# Patient Record
Sex: Male | Born: 1945
Health system: Southern US, Community
[De-identification: ages and names within clinical notes are randomized; demographics above are authoritative.]

## PROBLEM LIST (undated history)

## (undated) DIAGNOSIS — E785 Hyperlipidemia, unspecified: Secondary | ICD-10-CM

## (undated) DIAGNOSIS — R5383 Other fatigue: Secondary | ICD-10-CM

## (undated) DIAGNOSIS — I10 Essential (primary) hypertension: Secondary | ICD-10-CM

## (undated) HISTORY — DX: Hyperlipidemia, unspecified: E78.5

## (undated) HISTORY — PX: TONSILLECTOMY: SUR1361

## (undated) HISTORY — PX: APPENDECTOMY: SHX54

## (undated) HISTORY — DX: Essential (primary) hypertension: I10

## (undated) HISTORY — DX: Other fatigue: R53.83

---

## 2004-01-21 ENCOUNTER — Ambulatory Visit (HOSPITAL_COMMUNITY): Admission: RE | Admit: 2004-01-21 | Discharge: 2004-01-21 | Payer: Self-pay | Admitting: *Deleted

## 2010-06-16 ENCOUNTER — Encounter: Payer: Self-pay | Admitting: Cardiovascular Disease

## 2010-06-16 ENCOUNTER — Ambulatory Visit (INDEPENDENT_AMBULATORY_CARE_PROVIDER_SITE_OTHER): Payer: BC Managed Care – PPO | Admitting: Cardiovascular Disease

## 2010-06-16 DIAGNOSIS — R5381 Other malaise: Secondary | ICD-10-CM

## 2010-06-16 DIAGNOSIS — R5383 Other fatigue: Secondary | ICD-10-CM | POA: Insufficient documentation

## 2010-06-16 DIAGNOSIS — R0789 Other chest pain: Secondary | ICD-10-CM

## 2010-06-16 DIAGNOSIS — E785 Hyperlipidemia, unspecified: Secondary | ICD-10-CM

## 2010-06-16 DIAGNOSIS — I10 Essential (primary) hypertension: Secondary | ICD-10-CM

## 2010-06-16 NOTE — Assessment & Plan Note (Signed)
His blood pressure is fairly well-controlled. He is tolerating the Bystolic very well. We'll continue with the same Medications. I'll see him in 6 months

## 2010-06-16 NOTE — Assessment & Plan Note (Signed)
He has not had any further episodes of chest pain. We'll continue to watch him closely.

## 2010-06-16 NOTE — Progress Notes (Signed)
Problem list: Hypertension Chest pain Fatigue  Medications:  Vitamin E Aspirin 81 mg a day Bystolic 10 mg a day Simvastatin 40 mg a day  Subjective:  Tim James is a middle-aged gentleman with the above-noted medical history. He presents today for followup visit. He has not had any problems. He is tolerating his medications quite well.  The patient is alert and oriented x 3.  The mood and affect are normal.  Wt. Is 221.   BP is 130/80.  HR is 80.  The HEENT exam reveals that the sclera are nonicteric.  The mucous membranes are moist.  The carotids are 2+ without bruits.  There is no thyromegaly.  There is no JVD.  The lungs are clear.  The chest wall is non tender.  The heart exam reveals a regular rate with a normal S1 and S2.  There are no murmurs, gallops, or rubs.  The PMI is not displaced.   Abdominal exam reveals good bowel sounds.  There is no guarding or rebound.  There is no hepatosplenomegaly or tenderness.  There are no masses.  Exam of the legs reveal no clubbing, cyanosis, or edema.  The legs are without rashes.  The distal pulses are intact.  Cranial nerves II - XII are intact.  Motor and sensory functions are intact.  The gait is normal.

## 2010-06-16 NOTE — Assessment & Plan Note (Signed)
He was then simvastatin for now. We will check a lipid profile and CMET at his medical doctor in the next week or so.

## 2010-06-16 NOTE — Assessment & Plan Note (Signed)
His fatigue seems to be stable.

## 2010-10-23 ENCOUNTER — Other Ambulatory Visit: Payer: Self-pay | Admitting: Cardiology

## 2010-10-24 ENCOUNTER — Other Ambulatory Visit: Payer: Self-pay | Admitting: *Deleted

## 2010-10-24 MED ORDER — NEBIVOLOL HCL 10 MG PO TABS: 10.0000 mg | ORAL_TABLET | Freq: Every day | ORAL | Status: DC

## 2010-10-24 MED ORDER — SIMVASTATIN 40 MG PO TABS: 40.0000 mg | ORAL_TABLET | Freq: Every day | ORAL | Status: DC

## 2010-10-24 NOTE — Telephone Encounter (Signed)
Fax received from pharmacy. Refill completed. Spoke with pt had labs drawn yesterday at [facility] and is having them faxed here Alfonso Ramus RN

## 2011-04-18 ENCOUNTER — Other Ambulatory Visit: Payer: Self-pay | Admitting: Cardiovascular Disease

## 2011-08-06 ENCOUNTER — Other Ambulatory Visit: Payer: Self-pay | Admitting: Cardiovascular Disease

## 2011-08-10 NOTE — Telephone Encounter (Signed)
Pt needs appointment then refill can be made Fax Received. Refill Completed. Tim James (R.M.A)   

## 2011-09-09 ENCOUNTER — Encounter: Payer: Self-pay | Admitting: *Deleted

## 2011-10-20 ENCOUNTER — Other Ambulatory Visit: Payer: Self-pay | Admitting: *Deleted

## 2011-10-20 MED ORDER — NEBIVOLOL HCL 10 MG PO TABS: 10.0000 mg | ORAL_TABLET | Freq: Every day | ORAL | Status: DC

## 2011-10-20 NOTE — Telephone Encounter (Signed)
Pt needs appointment then refill can be made Fax Received. Refill Completed. Amad Mau Chowoe (R.M.A)   

## 2011-10-22 ENCOUNTER — Other Ambulatory Visit: Payer: Self-pay | Admitting: *Deleted

## 2011-10-22 MED ORDER — SIMVASTATIN 40 MG PO TABS: 40.0000 mg | ORAL_TABLET | Freq: Every day | ORAL | Status: DC

## 2011-10-22 NOTE — Telephone Encounter (Signed)
Opened in Error Pt needs appointment then refill can be made

## 2011-10-22 NOTE — Telephone Encounter (Signed)
Pt needs appointment then more refill can be made Fax Received. Refill Completed. Tim James (R.M.A)

## 2011-10-26 ENCOUNTER — Other Ambulatory Visit: Payer: Self-pay | Admitting: *Deleted

## 2011-10-26 NOTE — Telephone Encounter (Signed)
Opened in Error.

## 2011-11-27 ENCOUNTER — Ambulatory Visit (INDEPENDENT_AMBULATORY_CARE_PROVIDER_SITE_OTHER): Payer: BC Managed Care – PPO | Admitting: Cardiovascular Disease

## 2011-11-27 ENCOUNTER — Encounter: Payer: Self-pay | Admitting: Cardiovascular Disease

## 2011-11-27 VITALS — BP 112/64 | HR 64 | Ht 71.75 in | Wt 226.0 lb

## 2011-11-27 DIAGNOSIS — E785 Hyperlipidemia, unspecified: Secondary | ICD-10-CM

## 2011-11-27 DIAGNOSIS — I10 Essential (primary) hypertension: Secondary | ICD-10-CM

## 2011-11-27 LAB — HEPATIC FUNCTION PANEL
Alkaline Phosphatase: 61 U/L (ref 39–117)
Bilirubin, Direct: 0.1 mg/dL (ref 0.0–0.3)
Total Bilirubin: 0.8 mg/dL (ref 0.3–1.2)

## 2011-11-27 LAB — LIPID PANEL
Cholesterol: 174 mg/dL (ref 0–200)
LDL Cholesterol: 106 mg/dL — ABNORMAL HIGH (ref 0–99)
VLDL: 17.6 mg/dL (ref 0.0–40.0)

## 2011-11-27 LAB — BASIC METABOLIC PANEL
CO2: 29 mEq/L (ref 19–32)
Calcium: 9.3 mg/dL (ref 8.4–10.5)
GFR: 73.97 mL/min (ref >60.00)
Potassium: 3.9 mEq/L (ref 3.5–5.1)
Sodium: 138 mEq/L (ref 135–145)

## 2011-11-27 NOTE — Patient Instructions (Addendum)
Your physician recommends that you return for a FASTING lipid profile: today and in 1 year   Your physician wants you to follow-up in: 1 year  You will receive a reminder letter in the mail two months in advance. If you don't receive a letter, please call our office to schedule the follow-up appointment.  

## 2011-11-27 NOTE — Assessment & Plan Note (Signed)
Tim James is doing well. He's tolerating the simvastatin. We'll check fasting labs today. I'll see him again in one year for repeat office visit and fasting labs.

## 2011-11-27 NOTE — Assessment & Plan Note (Signed)
Vitals blood pressure is normal. We'll continue with the current dose of Bystolic.

## 2011-11-27 NOTE — Progress Notes (Signed)
    Tim James Date of Birth  [birth year]       [facility] Office [address]  [address] [place], [place]  [address]   [place], [place]  [address] [phone]     [phone]   Fax  [phone]    Fax [phone]  Problem List: 1.  Hypertension  2.  Chest pain  3.  Fatigue 4. Hyperlipidemia  History of Present Illness:  Tim James is a 66 y.o. gentleman with hx of HTN and hyperlipidemia.  He has done well.  He does not exercise as much as he would like.  He has not had any chest pain or dyspnea.   Current Outpatient Prescriptions on File Prior to Visit  Medication Sig Dispense Refill  . Ascorbic Acid (VITAMIN C) 100 MG tablet Take 100 mg by mouth daily.        [name] aspirin 81 MG tablet Take 81 mg by mouth daily.        . fish oil-omega-3 fatty acids 1000 MG capsule Take 2 g by mouth daily.        . nebivolol (BYSTOLIC) 10 MG tablet Take 1 tablet (10 mg total) by mouth daily.  30 tablet  0  . saw palmetto 160 MG capsule Take 160 mg by mouth 2 (two) times daily.        . simvastatin (ZOCOR) 40 MG tablet Take 1 tablet (40 mg total) by mouth at bedtime.  30 tablet  1  . vitamin E 400 UNIT capsule Take 400 Units by mouth daily.          Not on File  Past Medical History  Diagnosis Date  . Hypertension   . Hyperlipidemia   . Fatigue     Past Surgical History  Procedure Date  . Appendectomy   . Tonsillectomy     History  Smoking status  . Never Smoker   Smokeless tobacco  . Never Used    History  Alcohol Use No    Family History  Problem Relation Age of Onset  . Hypertension Mother   . Angina Mother   . Hypertension Father   . Hypertension Sister   . Hypertension Brother   . Hypertension Sister     Reviw of Systems:  Reviewed in the HPI.  All other systems are negative.  Physical Exam: Blood pressure 112/64, pulse 64, height 5' 11.75" (1.822 m), weight 226 lb (102.513 kg). General: Well  developed, well nourished, in no acute distress.  Head: Normocephalic, atraumatic, sclera non-icteric, mucus membranes are moist,   Neck: Supple. Carotids are 2 + without bruits. No JVD  Lungs: Clear bilaterally to auscultation.  Heart: regular rate.  normal  S1 S2. No murmurs, gallops or rubs.  Abdomen: Soft, non-tender, non-distended with normal bowel sounds. No hepatomegaly. No rebound/guarding. No masses.  Msk:  Strength and tone are normal  Extremities: No clubbing or cyanosis. No edema.  Distal pedal pulses are 2+ and equal bilaterally.  Neuro: Alert and oriented X 3. Moves all extremities spontaneously.  Psych:  Responds to questions appropriately with a normal affect.  ECG: 11/27/2011 normal sinus rhythm at 65 beats a minute. He has no ST or T wave changes.  Assessment / Plan:

## 2011-12-09 ENCOUNTER — Other Ambulatory Visit: Payer: Self-pay | Admitting: *Deleted

## 2011-12-09 MED ORDER — NEBIVOLOL HCL 10 MG PO TABS: 10.0000 mg | ORAL_TABLET | Freq: Every day | ORAL | Status: DC

## 2011-12-09 NOTE — Telephone Encounter (Signed)
Fax Received. Refill Completed. Tim James (R.M.A)   

## 2012-01-05 ENCOUNTER — Telehealth: Payer: Self-pay | Admitting: Cardiovascular Disease

## 2012-01-05 MED ORDER — SIMVASTATIN 40 MG PO TABS: 40.0000 mg | ORAL_TABLET | Freq: Every day | ORAL | Status: DC

## 2012-01-05 NOTE — Telephone Encounter (Signed)
Fax Received. Refill Completed. Stefana Lodico Chowoe (R.M.A)   

## 2012-01-05 NOTE — Telephone Encounter (Signed)
New Problem:    Called in needing a 30 day refill of the patient's simvastatin (ZOCOR) 40 MG tablet.  Please call back.

## 2012-02-19 ENCOUNTER — Encounter: Payer: Self-pay | Admitting: *Deleted

## 2012-06-22 ENCOUNTER — Other Ambulatory Visit: Payer: Self-pay | Admitting: *Deleted

## 2012-06-22 MED ORDER — NEBIVOLOL HCL 10 MG PO TABS: 10.0000 mg | ORAL_TABLET | Freq: Every day | ORAL | Status: DC

## 2012-06-22 NOTE — Telephone Encounter (Signed)
Fax Received. Refill Completed. Josiah Wojtaszek Chowoe (R.M.A)   

## 2012-06-30 ENCOUNTER — Other Ambulatory Visit: Payer: Self-pay | Admitting: *Deleted

## 2012-06-30 NOTE — Telephone Encounter (Signed)
Opened in Error.

## 2012-07-28 ENCOUNTER — Other Ambulatory Visit: Payer: Self-pay | Admitting: *Deleted

## 2012-07-28 MED ORDER — SIMVASTATIN 40 MG PO TABS: 40.0000 mg | ORAL_TABLET | Freq: Every day | ORAL | Status: DC

## 2012-07-28 NOTE — Telephone Encounter (Signed)
Fax Received. Refill Completed. Teigen Parslow Chowoe (R.M.A)   

## 2012-12-21 ENCOUNTER — Other Ambulatory Visit: Payer: Self-pay | Admitting: *Deleted

## 2012-12-21 MED ORDER — NEBIVOLOL HCL 10 MG PO TABS: 10.0000 mg | ORAL_TABLET | Freq: Every day | ORAL | Status: DC

## 2012-12-21 NOTE — Telephone Encounter (Signed)
Fax Received. Refill Completed. Tim James (R.M.A)   

## 2013-03-20 ENCOUNTER — Ambulatory Visit (INDEPENDENT_AMBULATORY_CARE_PROVIDER_SITE_OTHER): Payer: BC Managed Care – PPO | Admitting: Cardiovascular Disease

## 2013-03-20 ENCOUNTER — Encounter: Payer: Self-pay | Admitting: Cardiovascular Disease

## 2013-03-20 VITALS — BP 129/76 | HR 74 | Ht 71.0 in | Wt 230.0 lb

## 2013-03-20 DIAGNOSIS — I10 Essential (primary) hypertension: Secondary | ICD-10-CM

## 2013-03-20 DIAGNOSIS — E785 Hyperlipidemia, unspecified: Secondary | ICD-10-CM

## 2013-03-20 NOTE — Progress Notes (Signed)
[name] Date of Birth  1946-03-27       [facility] Office [address]  [address] [place], [place]  [address]   [place], [place]  [address] [phone]     [phone]   Fax  [phone]    Fax [phone]  Problem List: 1.  Hypertension  2.  Chest pain  3.  Fatigue 4. Hyperlipidemia  History of Present Illness:  Tim James is a 67 y.o. gentleman with hx of HTN and hyperlipidemia.  He has done well.  He does not exercise as much as he would like.  He has not had any chest pain or dyspnea.  Nov.  10, 2014:  Tim James is doing well.   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    He needs to walk more.      Current Outpatient Prescriptions on File Prior to Visit  Medication Sig Dispense Refill  . Ascorbic Acid (VITAMIN C) 100 MG tablet Take 100 mg by mouth daily.        [name] aspirin 81 MG tablet Take 81 mg by mouth daily.        . B Complex-C (B-COMPLEX WITH VITAMIN C) tablet Take 1 tablet by mouth daily.      . Cholecalciferol (VITAMIN D-3 PO) Take 1,000 Units by mouth daily.      . fish oil-omega-3 fatty acids 1000 MG capsule Take 2 g by mouth daily.        . Flaxseed, Linseed, 1000 MG CAPS Take 1,000 mg by mouth daily.      [name] glucosamine-chondroitin 500-400 MG tablet Take 1 tablet by mouth daily.      . nebivolol (BYSTOLIC) 10 MG tablet Take 1 tablet (10 mg total) by mouth daily.  30 tablet  3  . saw palmetto 160 MG capsule Take 160 mg by mouth 2 (two) times daily.        . simvastatin (ZOCOR) 40 MG tablet Take 1 tablet (40 mg total) by mouth at bedtime.  30 tablet  4  . vitamin E 400 UNIT capsule Take 400 Units by mouth daily.         No current facility-administered medications on file prior to visit.    No Known Allergies  Past Medical History  Diagnosis Date  . Hypertension   . Hyperlipidemia   . Fatigue     Past Surgical History  Procedure Laterality Date  . Appendectomy      . Tonsillectomy      History  Smoking status  . Never Smoker   Smokeless tobacco  . Never Used    History  Alcohol Use No    Family History  Problem Relation Age of Onset  . Hypertension Mother   . Angina Mother   . Hypertension Father   . Hypertension Sister   . Hypertension Brother   . Hypertension Sister     Reviw of Systems:  Reviewed in the HPI.  All other systems are negative.  Physical Exam: Blood pressure 129/76, pulse 74, height 5\' 11"  (1.803 m), weight 230 lb (104.327 kg). General: Well developed, well nourished, in no acute distress.  Head: Normocephalic, atraumatic, sclera non-icteric, mucus membranes are moist,   Neck: Supple. Carotids are 2 + without bruits. No JVD  Lungs: Clear bilaterally to auscultation.  Heart: regular rate.  normal  S1 S2. No murmurs, gallops or rubs.  Abdomen:  Soft, non-tender, non-distended with normal bowel sounds. No hepatomegaly. No rebound/guarding. No masses.  Msk:  Strength and tone are normal  Extremities: No clubbing or cyanosis. No edema.  Distal pedal pulses are 2+ and equal bilaterally.  Neuro: Alert and oriented X 3. Moves all extremities spontaneously.  Psych:  Responds to questions appropriately with a normal affect.  ECG: 03/20/2013 normal sinus rhythm at 65 beats a minute. He has no ST or T wave changes.  Assessment / Plan:

## 2013-03-20 NOTE — Assessment & Plan Note (Signed)
He remains on simvastatin. We'll check his fasting labs today. I seen again in one year. We'll check fasting labs in one year.

## 2013-03-20 NOTE — Patient Instructions (Signed)
Your physician recommends that you return for a FASTING lipid profile: TODAY  Your physician wants you to follow-up in: 1 YEAR  You will receive a reminder letter in the mail two months in advance. If you don't receive a letter, please call our office to schedule the follow-up appointment.  Your physician recommends that you continue on your current medications as directed. Please refer to the Current Medication list given to you today.   

## 2013-03-20 NOTE — Assessment & Plan Note (Signed)
Tim James is doing well. He's not having episodes of chest pain. We'll continue the same medications.

## 2013-03-21 LAB — LIPID PANEL
LDL Cholesterol: 88 mg/dL (ref 0–99)
Total CHOL/HDL Ratio: 3
VLDL: 25.2 mg/dL (ref 0.0–40.0)

## 2013-03-21 LAB — BASIC METABOLIC PANEL
BUN: 14 mg/dL (ref 6–23)
Chloride: 101 mEq/L (ref 96–112)
Creatinine, Ser: 1 mg/dL (ref 0.4–1.5)
Glucose, Bld: 93 mg/dL (ref 70–99)
Potassium: 4.1 mEq/L (ref 3.5–5.1)

## 2013-03-21 LAB — HEPATIC FUNCTION PANEL
Albumin: 4.1 g/dL (ref 3.5–5.2)
Alkaline Phosphatase: 62 U/L (ref 39–117)
Bilirubin, Direct: 0.1 mg/dL (ref 0.0–0.3)
Total Bilirubin: 0.7 mg/dL (ref 0.3–1.2)

## 2013-05-09 ENCOUNTER — Other Ambulatory Visit: Payer: Self-pay

## 2013-05-09 MED ORDER — SIMVASTATIN 40 MG PO TABS: 40.0000 mg | ORAL_TABLET | Freq: Every day | ORAL | Status: DC

## 2013-05-24 ENCOUNTER — Other Ambulatory Visit: Payer: Self-pay

## 2013-05-24 MED ORDER — NEBIVOLOL HCL 10 MG PO TABS: 10.0000 mg | ORAL_TABLET | Freq: Every day | ORAL | Status: DC

## 2013-12-03 ENCOUNTER — Other Ambulatory Visit: Payer: Self-pay | Admitting: Cardiovascular Disease

## 2014-01-03 ENCOUNTER — Other Ambulatory Visit: Payer: Self-pay | Admitting: Cardiovascular Disease

## 2014-02-10 ENCOUNTER — Other Ambulatory Visit: Payer: Self-pay | Admitting: Cardiovascular Disease

## 2014-03-19 ENCOUNTER — Other Ambulatory Visit: Payer: Self-pay | Admitting: Cardiovascular Disease

## 2014-03-21 ENCOUNTER — Other Ambulatory Visit (INDEPENDENT_AMBULATORY_CARE_PROVIDER_SITE_OTHER): Payer: BC Managed Care – PPO | Admitting: *Deleted

## 2014-03-21 ENCOUNTER — Ambulatory Visit (INDEPENDENT_AMBULATORY_CARE_PROVIDER_SITE_OTHER): Payer: BC Managed Care – PPO | Admitting: Cardiovascular Disease

## 2014-03-21 ENCOUNTER — Encounter: Payer: Self-pay | Admitting: Cardiovascular Disease

## 2014-03-21 VITALS — BP 120/78 | HR 60 | Ht 71.0 in | Wt 208.8 lb

## 2014-03-21 DIAGNOSIS — R0789 Other chest pain: Secondary | ICD-10-CM

## 2014-03-21 DIAGNOSIS — E785 Hyperlipidemia, unspecified: Secondary | ICD-10-CM

## 2014-03-21 DIAGNOSIS — I10 Essential (primary) hypertension: Secondary | ICD-10-CM

## 2014-03-21 LAB — BASIC METABOLIC PANEL
BUN: 16 mg/dL (ref 6–23)
CHLORIDE: 102 meq/L (ref 96–112)
CO2: 25 mEq/L (ref 19–32)
Calcium: 9.4 mg/dL (ref 8.4–10.5)
Creatinine, Ser: 1 mg/dL (ref 0.4–1.5)
GFR: 80.33 mL/min (ref >60.00)
Glucose, Bld: 112 mg/dL — ABNORMAL HIGH (ref 70–99)
Potassium: 4.1 mEq/L (ref 3.5–5.1)
SODIUM: 140 meq/L (ref 135–145)

## 2014-03-21 LAB — HEPATIC FUNCTION PANEL
ALK PHOS: 65 U/L (ref 39–117)
ALT: 25 U/L (ref 0–53)
AST: 23 U/L (ref 0–37)
Albumin: 3.6 g/dL (ref 3.5–5.2)
BILIRUBIN DIRECT: 0.1 mg/dL (ref 0.0–0.3)
Total Bilirubin: 1 mg/dL (ref 0.2–1.2)
Total Protein: 7.3 g/dL (ref 6.0–8.3)

## 2014-03-21 LAB — LIPID PANEL
Cholesterol: 156 mg/dL (ref 0–200)
HDL: 40.7 mg/dL (ref >39.00)
LDL Cholesterol: 104 mg/dL — ABNORMAL HIGH (ref 0–99)
NONHDL: 115.3
Total CHOL/HDL Ratio: 4
Triglycerides: 56 mg/dL (ref 0.0–149.0)
VLDL: 11.2 mg/dL (ref 0.0–40.0)

## 2014-03-21 NOTE — Assessment & Plan Note (Signed)
Blood pressure is well-controlled. Continue current medications. He's lost 20 pounds since this past September. Continue with the diet and exercise program.

## 2014-03-21 NOTE — Assessment & Plan Note (Signed)
He continues on the current dose of simvastatin. His lipids looked great last year. We'll check fasting labs today. We'll send a copy of these to his primary medical doctor.

## 2014-03-21 NOTE — Assessment & Plan Note (Signed)
He has  not had any episodes of CP in years. Continue to follow

## 2014-03-21 NOTE — Progress Notes (Signed)
Tim James Date of Birth  1945/09/09       [facility] Office [address], [address], [place], [place]  [address]   [place], [place]  [address] [phone]     [phone]   Fax  [phone]    Fax [phone]  Problem List: 1.  Hypertension  2.  Chest pain  3.  Fatigue 4. Hyperlipidemia  History of Present Illness:  Tim James is a 68 y.o. gentleman with hx of HTN and hyperlipidemia.  He has done well.  He does not exercise as much as he would like.  He has not had any chest pain or dyspnea.  Nov.  10, 2014:  Tim James is doing well.   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    He needs to walk more.     Nov. 11, 2015:  Tim James is seen back today for follow up of his HTN and hyperlipidemia. He still teaches middle school math.  Wants to work until 39. No Cp or dyspnea.   Walks around at school.  Walks the dogs at night. Has has lost about 20 lbs since Sept.  Feels better.   Current Outpatient Prescriptions on File Prior to Visit  Medication Sig Dispense Refill  . Ascorbic Acid (VITAMIN C) 100 MG tablet Take 100 mg by mouth daily.      [name] aspirin 81 MG tablet Take 81 mg by mouth daily.      . B Complex-C (B-COMPLEX WITH VITAMIN C) tablet Take 1 tablet by mouth daily.    . Cholecalciferol (VITAMIN D-3 PO) Take 1,000 Units by mouth daily.    . fish oil-omega-3 fatty acids 1000 MG capsule Take 2 g by mouth daily.      . Flaxseed, Linseed, 1000 MG CAPS Take 1,000 mg by mouth daily.    [name] glucosamine-chondroitin 500-400 MG tablet Take 1 tablet by mouth daily.    . nebivolol (BYSTOLIC) 10 MG tablet Take 1 tablet (10 mg total) by mouth daily. 90 tablet 3  . saw palmetto 160 MG capsule Take 160 mg by mouth 2 (two) times daily.      . simvastatin (ZOCOR) 40 MG tablet TAKE 1 TABLET BY MOUTH AT BEDTIME. 30 tablet 3  . vitamin E 400 UNIT capsule Take 400 Units by mouth daily.       No current  facility-administered medications on file prior to visit.    No Known Allergies  Past Medical History  Diagnosis Date  . Hypertension   . Hyperlipidemia   . Fatigue     Past Surgical History  Procedure Laterality Date  . Appendectomy    . Tonsillectomy      History  Smoking status  . Never Smoker   Smokeless tobacco  . Never Used    History  Alcohol Use No    Family History  Problem Relation Age of Onset  . Hypertension Mother   . Angina Mother   . Hypertension Father   . Hypertension Sister   . Hypertension Brother   . Hypertension Sister     Reviw of Systems:  Reviewed in the HPI.  All other systems are negative.  Physical Exam: Blood pressure 120/78, pulse 60, height 5\' 11"  (1.803 m), weight 208 lb 12.8 oz (94.711 kg). General: Well developed, well nourished, in no acute distress.  Head: Normocephalic, atraumatic, sclera non-icteric, mucus membranes are moist,  Neck: Supple. Carotids are 2 + without bruits. No JVD  Lungs: Clear bilaterally to auscultation.  Heart: regular rate.  normal  S1 S2. No murmurs, gallops or rubs.  Abdomen: Soft, non-tender, non-distended with normal bowel sounds. No hepatomegaly. No rebound/guarding. No masses.  Msk:  Strength and tone are normal  Extremities: No clubbing or cyanosis. No edema.  Distal pedal pulses are 2+ and equal bilaterally.  Neuro: Alert and oriented X 3. Moves all extremities spontaneously.  Psych:  Responds to questions appropriately with a normal affect.  ECG: 03/21/2014 NSR at 60, normal ECG   Assessment / Plan:

## 2014-03-21 NOTE — Patient Instructions (Signed)
Your physician recommends that you have lab work:  TODAY - cholesterol, liver, basic metabolic panel  Your physician recommends that you continue on your current medications as directed. Please refer to the Current Medication list given to you today.  Your physician wants you to follow-up in: 1 year with Dr. Nahser. You will receive a reminder letter in the mail two months in advance. If you don't receive a letter, please call our office to schedule the follow-up appointment.  

## 2014-06-16 ENCOUNTER — Other Ambulatory Visit: Payer: Self-pay | Admitting: Cardiovascular Disease

## 2014-08-20 ENCOUNTER — Other Ambulatory Visit: Payer: Self-pay | Admitting: Cardiovascular Disease

## 2014-11-05 ENCOUNTER — Other Ambulatory Visit: Payer: Self-pay

## 2015-04-02 ENCOUNTER — Other Ambulatory Visit: Payer: Self-pay | Admitting: Cardiovascular Disease

## 2015-05-31 ENCOUNTER — Ambulatory Visit: Payer: BC Managed Care – PPO | Admitting: Cardiovascular Disease

## 2015-06-12 ENCOUNTER — Ambulatory Visit (INDEPENDENT_AMBULATORY_CARE_PROVIDER_SITE_OTHER): Payer: BC Managed Care – PPO | Admitting: Cardiovascular Disease

## 2015-06-12 ENCOUNTER — Encounter: Payer: Self-pay | Admitting: Cardiovascular Disease

## 2015-06-12 VITALS — BP 152/102 | HR 59 | Ht 71.0 in | Wt 207.4 lb

## 2015-06-12 DIAGNOSIS — E785 Hyperlipidemia, unspecified: Secondary | ICD-10-CM | POA: Diagnosis not present

## 2015-06-12 DIAGNOSIS — I10 Essential (primary) hypertension: Secondary | ICD-10-CM | POA: Diagnosis not present

## 2015-06-12 LAB — COMPREHENSIVE METABOLIC PANEL
ALBUMIN: 4.3 g/dL (ref 3.6–5.1)
ALK PHOS: 53 U/L (ref 40–115)
ALT: 25 U/L (ref 9–46)
AST: 23 U/L (ref 10–35)
BILIRUBIN TOTAL: 1.1 mg/dL (ref 0.2–1.2)
BUN: 19 mg/dL (ref 7–25)
CALCIUM: 9.6 mg/dL (ref 8.6–10.3)
CO2: 31 mmol/L (ref 20–31)
Chloride: 100 mmol/L (ref 98–110)
Creat: 0.92 mg/dL (ref 0.70–1.25)
GLUCOSE: 103 mg/dL — AB (ref 65–99)
Potassium: 4.4 mmol/L (ref 3.5–5.3)
Sodium: 140 mmol/L (ref 135–146)
Total Protein: 7.2 g/dL (ref 6.1–8.1)

## 2015-06-12 LAB — LIPID PANEL
CHOLESTEROL: 153 mg/dL (ref 125–200)
HDL: 53 mg/dL (ref >=40)
LDL Cholesterol: 87 mg/dL (ref <130)
Total CHOL/HDL Ratio: 2.9 Ratio (ref <=5.0)
Triglycerides: 64 mg/dL (ref <150)
VLDL: 13 mg/dL (ref <30)

## 2015-06-12 NOTE — Progress Notes (Signed)
Tim James Date of Birth  02/23/1946       [facility] Office [address], [place], [place]  [address]   [place], [place]  [address] [phone]     [phone]   Fax  [phone]    Fax [phone]  Problem List: 1.  Hypertension  2.  Chest pain  3.  Fatigue 4. Hyperlipidemia  History of Present Illness:  Tim James is a 70 y.o. gentleman with hx of HTN and hyperlipidemia.  He has done well.  He does not exercise as much as he would like.  He has not had any chest pain or dyspnea.  Nov.  10, 2014:  Tim James is doing well.   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    He needs to walk more.     Nov. 11, 2015:  Tim James is seen back today for follow up of his HTN and hyperlipidemia. 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 No Cp or dyspnea.   Walks around at school.  Walks the dogs at night. Has has lost about 20 lbs since Sept.  Feels better.  Feb. 1, 2017:  Doing well.  BP is a bit high this am - some stresses this am .  Avoiding salt. Has not been exercising much , lots of left knee pain    Current Outpatient Prescriptions on File Prior to Visit  Medication Sig Dispense Refill  . Ascorbic Acid (VITAMIN C) 100 MG tablet Take 100 mg by mouth daily.      [name] aspirin 81 MG tablet Take 81 mg by mouth daily.      . B Complex-C (B-COMPLEX WITH VITAMIN C) tablet Take 1 tablet by mouth daily.    [name] BYSTOLIC 10 MG tablet TAKE 1 TABLET BY MOUTH DAILY. 90 tablet 0  . Cholecalciferol (VITAMIN D-3 PO) Take 1,000 Units by mouth daily.    . fish oil-omega-3 fatty acids 1000 MG capsule Take 2 g by mouth daily.      . Flaxseed, Linseed, 1000 MG CAPS Take 1,000 mg by mouth daily.    [name] glucosamine-chondroitin 500-400 MG tablet Take 1 tablet by mouth daily.    . saw palmetto 160 MG capsule Take 160 mg by mouth 2 (two) times daily.      . simvastatin (ZOCOR) 40 MG tablet  TAKE 1 TABLET BY MOUTH AT BEDTIME. 30 tablet 6  . vitamin E 400 UNIT capsule Take 400 Units by mouth daily.       No current facility-administered medications on file prior to visit.    No Known Allergies  Past Medical History  Diagnosis Date  . Hypertension   . Hyperlipidemia   . Fatigue     Past Surgical History  Procedure Laterality Date  . Appendectomy    . Tonsillectomy      History  Smoking status  . Never Smoker   Smokeless tobacco  . Never Used    History  Alcohol Use No    Family History  Problem Relation Age of Onset  . Hypertension Mother   . Angina Mother   . Hypertension Father   . Hypertension Sister   . Hypertension Brother   . Hypertension Sister     Reviw of Systems:  Reviewed in the HPI.  All other systems are negative.  Physical Exam: Blood pressure 152/102, pulse 59,  height 5\' 11"  (1.803 m), weight 207 lb 6.4 oz (94.076 kg). General: Well developed, well nourished, in no acute distress.  Head: Normocephalic, atraumatic, sclera non-icteric, mucus membranes are moist,   Neck: Supple. Carotids are 2 + without bruits. No JVD  Lungs: Clear bilaterally to auscultation.  Heart: regular rate.  normal  S1 S2. No murmurs, gallops or rubs.  Abdomen: Soft, non-tender, non-distended with normal bowel sounds. No hepatomegaly. No rebound/guarding. No masses.  Msk:  Strength and tone are normal  Extremities: No clubbing or cyanosis. No edema.  Distal pedal pulses are 2+ and equal bilaterally.  Neuro: Alert and oriented X 3. Moves all extremities spontaneously.  Psych:  Responds to questions appropriately with a normal affect.  ECG: Feb. 1, 2017:  Sinus brady at 59.   Otherwise normal .    Assessment / Plan:   1.  Hypertension  -  Blood pressures fairly well-controlled. I've advised him to continue with a good diet and exercise program. He plans on  Losing weight. 2.  Chest pain  3.  Fatigue 4. Hyperlipidemia -  We'll check fasting lipids  today.    Nahser, Wonda Cheng, MD  06/12/2015 8:48 AM    [address],  [place], [place]  [address] Pager [phone] Phone: [phone]; Fax: [phone]   [facility]  [address] [place], [place]  [address] [phone]   Fax [phone]

## 2015-06-12 NOTE — Patient Instructions (Signed)
Medication Instructions:  Your physician recommends that you continue on your current medications as directed. Please refer to the Current Medication list given to you today.   Labwork: TODAY - cholesterol, liver, basic metabolic panel   Testing/Procedures: None Ordered   Follow-Up: Your physician wants you to follow-up in: 1 year with Dr. Nahser.  You will receive a reminder letter in the mail two months in advance. If you don't receive a letter, please call our office to schedule the follow-up appointment.   If you need a refill on your cardiac medications before your next appointment, please call your pharmacy.   Thank you for choosing [facility]! Sequan Auxier, RN [phone]   

## 2015-06-17 ENCOUNTER — Other Ambulatory Visit: Payer: Self-pay | Admitting: Cardiovascular Disease

## 2015-06-27 ENCOUNTER — Other Ambulatory Visit: Payer: Self-pay | Admitting: Cardiovascular Disease

## 2016-05-01 ENCOUNTER — Telehealth: Payer: Self-pay | Admitting: Cardiovascular Disease

## 2016-05-01 NOTE — Telephone Encounter (Signed)
New Message  Pt voiced wanting to know if he can have an EKG.  Please f/u with pt

## 2016-05-01 NOTE — Telephone Encounter (Signed)
Spoke with patient who states he is having muscle aches in both sides of his upper chest. Denies that pain worsens with movement.  He denies SOB but states he has had a viral cold recently.  He monitors his BP regularly and states it has been normal.  He denies chest pressure with exertion.  He states he thinks it is muscular pain because it is the same on both sides.  I moved his 1 year follow-up appointment to January. He verbalized understanding and agreement and thanked me for the call.

## 2016-05-22 ENCOUNTER — Encounter: Payer: Self-pay | Admitting: Cardiovascular Disease

## 2016-05-22 ENCOUNTER — Ambulatory Visit (INDEPENDENT_AMBULATORY_CARE_PROVIDER_SITE_OTHER): Payer: BC Managed Care – PPO | Admitting: Cardiovascular Disease

## 2016-05-22 VITALS — BP 146/86 | HR 66 | Ht 71.0 in | Wt 220.8 lb

## 2016-05-22 DIAGNOSIS — I10 Essential (primary) hypertension: Secondary | ICD-10-CM

## 2016-05-22 DIAGNOSIS — E782 Mixed hyperlipidemia: Secondary | ICD-10-CM

## 2016-05-22 DIAGNOSIS — R0789 Other chest pain: Secondary | ICD-10-CM | POA: Diagnosis not present

## 2016-05-22 NOTE — Progress Notes (Signed)
Tim James Date of Birth  Dec 19, 1945       [facility] Office [address] [place], [place], [place]  [address]   [place], [place]  [address] [phone]     [phone]   Fax  [phone]    Fax [phone]  Problem List: 1.  Hypertension  2.  Chest pain  3.  Fatigue 4. Hyperlipidemia  History of Present Illness:  Tim James is a 71 y.o. gentleman with hx of HTN and hyperlipidemia.  He has done well.  He does not exercise as much as he would like.  He has not had any chest pain or dyspnea.  Nov.  10, 2014:  Tim James is doing well.   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    He needs to walk more.     Nov. 11, 2015:  Tim James is seen back today for follow up of his HTN and hyperlipidemia. He still teaches middle school math.  Wants to work until 39. No Cp or dyspnea.   Walks around at school.  Walks the dogs at night. Has has lost about 20 lbs since Sept.  Feels better.  Feb. 1, 2017:  Doing well.  BP is a bit high this am - some stresses this am .  Avoiding salt. Has not been exercising much , lots of left knee pain   Jan. 12, 2018:  Tim James is doing well.   Has had some chest pressure for the past month. The pain moves around , from 1 side to another, to the back Is not worsened with activity. Has a cough, has had a minor cold. Has not been walking much  Is working at a new job - now work in [place] .    Current Outpatient Prescriptions on File Prior to Visit  Medication Sig Dispense Refill  . Ascorbic Acid (VITAMIN C) 100 MG tablet Take 100 mg by mouth daily.      [name] aspirin 81 MG tablet Take 81 mg by mouth daily.      . B Complex-C (B-COMPLEX WITH VITAMIN C) tablet Take 1 tablet by mouth daily.    . Cholecalciferol (VITAMIN D-3 PO) Take 1,000 Units by mouth daily.    . fish oil-omega-3 fatty acids 1000 MG capsule Take 2 g by mouth daily.      . Flaxseed, Linseed, 1000 MG  CAPS Take 1,000 mg by mouth daily.    [name] glucosamine-chondroitin 500-400 MG tablet Take 1 tablet by mouth daily.    . nebivolol (BYSTOLIC) 10 MG tablet Take 1 tablet (10 mg total) by mouth daily. 90 tablet 3  . saw palmetto 160 MG capsule Take 160 mg by mouth 2 (two) times daily.      . simvastatin (ZOCOR) 40 MG tablet TAKE 1 TABLET BY MOUTH AT BEDTIME. 30 tablet 11  . vitamin E 400 UNIT capsule Take 400 Units by mouth daily.       No current facility-administered medications on file prior to visit.     No Known Allergies  Past Medical History:  Diagnosis Date  . Fatigue   . Hyperlipidemia   . Hypertension     Past Surgical History:  Procedure Laterality Date  . APPENDECTOMY    . TONSILLECTOMY      History  Smoking Status  . Never Smoker  Smokeless Tobacco  . Never Used    History  Alcohol Use No    Family History  Problem Relation Age of Onset  . Hypertension Mother   . Angina Mother   . Hypertension Father   . Hypertension Sister   . Hypertension Brother   . Hypertension Sister     Reviw of Systems:  Reviewed in the HPI.  All other systems are negative.  Physical Exam: Blood pressure (!) 146/86, pulse 66, height 5\' 11"  (1.803 m), weight 220 lb 12.8 oz (100.2 kg). General: Well developed, well nourished, in no acute distress.  Head: Normocephalic, atraumatic, sclera non-icteric, mucus membranes are moist,   Neck: Supple. Carotids are 2 + without bruits. No JVD  Lungs: Clear bilaterally to auscultation.  Heart: regular rate.  normal  S1 S2. No murmurs, gallops or rubs.  Abdomen: Soft, non-tender, non-distended with normal bowel sounds. No hepatomegaly. No rebound/guarding. No masses.  Msk:  Strength and tone are normal  Extremities: No clubbing or cyanosis. No edema.  Distal pedal pulses are 2+ and equal bilaterally.  Neuro: Alert and oriented X 3. Moves all extremities spontaneously.  Psych:  Responds to questions appropriately with a normal  affect.  ECG: Jan. 12, 2018:   NSR  at 90.   Normal ECG   Assessment / Plan:   1.  Hypertension  -  Blood pressures fairly well-controlled. I've advised him to continue with a good diet and exercise program. He plans on  Losing weight. 2.  Chest pain - very atypical  I do not think that this needs any additional workup. He'll continue to keep an eye on this. I have asked him to call me if he has any further episodes of chest pain-especially if they occur with exercise.  Doubt  3.  Fatigue 4. Hyperlipidemia -  We'll check fasting lipids today.    Mertie Moores, MD  05/22/2016 9:43 AM    [place],  [place], [place]  [address] Pager [phone] Phone: [phone]; Fax: [phone]

## 2016-05-22 NOTE — Patient Instructions (Signed)
Medication Instructions:  Your physician recommends that you continue on your current medications as directed. Please refer to the Current Medication list given to you today.   Labwork: TODAY - cholesterol, complete metabolic panel   Testing/Procedures: None Ordered   Follow-Up: Your physician wants you to follow-up in: 1 year with Dr. Nahser.  You will receive a reminder letter in the mail two months in advance. If you don't receive a letter, please call our office to schedule the follow-up appointment.   If you need a refill on your cardiac medications before your next appointment, please call your pharmacy.   Thank you for choosing [facility]! Yerachmiel Spinney, RN [phone]    

## 2016-05-23 LAB — COMPREHENSIVE METABOLIC PANEL
ALK PHOS: 65 IU/L (ref 39–117)
ALT: 25 IU/L (ref 0–44)
AST: 20 IU/L (ref 0–40)
Albumin/Globulin Ratio: 1.7 (ref 1.2–2.2)
Albumin: 4.3 g/dL (ref 3.6–4.8)
BILIRUBIN TOTAL: 0.8 mg/dL (ref 0.0–1.2)
BUN / CREAT RATIO: 17 (ref 10–24)
BUN: 17 mg/dL (ref 8–27)
CHLORIDE: 100 mmol/L (ref 96–106)
CO2: 28 mmol/L (ref 18–29)
Calcium: 9.2 mg/dL (ref 8.6–10.2)
Creatinine, Ser: 1 mg/dL (ref 0.76–1.27)
GFR calc Af Amer: 89 mL/min/{1.73_m2} (ref >59)
GFR calc non Af Amer: 77 mL/min/{1.73_m2} (ref >59)
Globulin, Total: 2.5 g/dL (ref 1.5–4.5)
Glucose: 120 mg/dL — ABNORMAL HIGH (ref 65–99)
Potassium: 4.6 mmol/L (ref 3.5–5.2)
Sodium: 141 mmol/L (ref 134–144)
Total Protein: 6.8 g/dL (ref 6.0–8.5)

## 2016-05-23 LAB — LIPID PANEL
CHOL/HDL RATIO: 3.6 ratio (ref 0.0–5.0)
Cholesterol, Total: 181 mg/dL (ref 100–199)
HDL: 50 mg/dL (ref >39)
LDL CALC: 115 mg/dL — AB (ref 0–99)
Triglycerides: 81 mg/dL (ref 0–149)
VLDL CHOLESTEROL CAL: 16 mg/dL (ref 5–40)

## 2016-05-26 ENCOUNTER — Telehealth: Payer: Self-pay | Admitting: Cardiovascular Disease

## 2016-05-26 DIAGNOSIS — E782 Mixed hyperlipidemia: Secondary | ICD-10-CM

## 2016-05-26 MED ORDER — ATORVASTATIN CALCIUM 20 MG PO TABS: 20.0000 mg | ORAL_TABLET | Freq: Every day | ORAL | 3 refills | Status: DC

## 2016-05-26 NOTE — Telephone Encounter (Signed)
Reviewed lab results and plan of care with patient who verbalized understanding and agreement to stop simvastatin and start atorvastatin 20 mg. He is scheduled for repeat lab appointment on 4/18.  I advised him to call back with questions or concerns prior to that time. He thanked me for the call.

## 2016-05-26 NOTE — Telephone Encounter (Signed)
-----   Message from Thayer Headings, MD sent at 05/26/2016 11:54 AM EST ----- The Simva does not seem to be strong enough. DC Simvastatin  Start Atorvastatin 20 mg a day .   Check fasting labs in 3 months .

## 2016-05-26 NOTE — Telephone Encounter (Signed)
Follow Up:; ° ° °Returning your call. °

## 2016-06-23 ENCOUNTER — Ambulatory Visit: Payer: BC Managed Care – PPO | Admitting: Cardiovascular Disease

## 2016-07-08 ENCOUNTER — Other Ambulatory Visit: Payer: Self-pay | Admitting: Cardiovascular Disease

## 2016-08-26 ENCOUNTER — Other Ambulatory Visit: Payer: BC Managed Care – PPO

## 2016-09-03 ENCOUNTER — Other Ambulatory Visit: Payer: BC Managed Care – PPO

## 2016-09-08 ENCOUNTER — Other Ambulatory Visit: Payer: BC Managed Care – PPO | Admitting: *Deleted

## 2016-09-08 DIAGNOSIS — E782 Mixed hyperlipidemia: Secondary | ICD-10-CM

## 2016-09-08 NOTE — Addendum Note (Signed)
Addended by: Eulis Foster on: 09/08/2016 03:37 PM   Modules accepted: Orders

## 2016-09-09 ENCOUNTER — Other Ambulatory Visit: Payer: Self-pay | Admitting: Nurse Practitioner

## 2016-09-09 LAB — COMPREHENSIVE METABOLIC PANEL
A/G RATIO: 1.8 (ref 1.2–2.2)
ALT: 25 IU/L (ref 0–44)
AST: 24 IU/L (ref 0–40)
Albumin: 4.4 g/dL (ref 3.6–4.8)
Alkaline Phosphatase: 74 IU/L (ref 39–117)
BUN/Creatinine Ratio: 15 (ref 10–24)
BUN: 13 mg/dL (ref 8–27)
Bilirubin Total: 1.1 mg/dL (ref 0.0–1.2)
CALCIUM: 9.1 mg/dL (ref 8.6–10.2)
CHLORIDE: 97 mmol/L (ref 96–106)
CO2: 27 mmol/L (ref 18–29)
Creatinine, Ser: 0.84 mg/dL (ref 0.76–1.27)
GFR calc Af Amer: 104 mL/min/{1.73_m2} (ref >59)
GFR, EST NON AFRICAN AMERICAN: 90 mL/min/{1.73_m2} (ref >59)
GLUCOSE: 83 mg/dL (ref 65–99)
Globulin, Total: 2.5 g/dL (ref 1.5–4.5)
POTASSIUM: 4.4 mmol/L (ref 3.5–5.2)
Sodium: 139 mmol/L (ref 134–144)
Total Protein: 6.9 g/dL (ref 6.0–8.5)

## 2016-09-09 LAB — LIPID PANEL
CHOL/HDL RATIO: 3.5 ratio (ref 0.0–5.0)
Cholesterol, Total: 152 mg/dL (ref 100–199)
HDL: 44 mg/dL (ref >39)
LDL Calculated: 89 mg/dL (ref 0–99)
TRIGLYCERIDES: 94 mg/dL (ref 0–149)
VLDL CHOLESTEROL CAL: 19 mg/dL (ref 5–40)

## 2016-09-09 MED ORDER — ATORVASTATIN CALCIUM 20 MG PO TABS: 20.0000 mg | ORAL_TABLET | Freq: Every day | ORAL | 3 refills | Status: DC

## 2017-02-18 ENCOUNTER — Telehealth: Payer: Self-pay | Admitting: Cardiovascular Disease

## 2017-02-18 NOTE — Telephone Encounter (Signed)
States he is changing his lifestyle, eating lots of fruits/veges and no meats. He has more muscle aches than he likes and isn't sure if its age or r/t medication.   Currently plan is to f/u in Jan 2019 w/ Dr. Acie Fredrickson. He would like to stop Atorvastatin, continue w/ lifestyle changes and then follow up with lab work in Jan to see if working.  Will forward to Dr. Acie Fredrickson to address. Pt understands it may be next week before hearing back from the office.

## 2017-02-18 NOTE — Telephone Encounter (Signed)
New Message  Pt c/o medication issue:  1. Name of Medication: Atorvastatin   2. How are you currently taking this medication (dosage and times per day)? 20mg    3. Are you having a reaction (difficulty breathing--STAT)? no  4. What is your medication issue? Per pt would like to speak with RN about stopping medication. Please call back to discuss

## 2017-02-19 NOTE — Telephone Encounter (Signed)
Agree with plan as noted

## 2017-02-19 NOTE — Telephone Encounter (Signed)
Spoke with patient and advised that he may reduce or d/c atorvastatin to see if his symptoms resolve. He states he has radically changed his diet and is eating mostly fruits and vegetables, very little meat except fish. He would like to wean himself off atorvastatin so that when he returns in January his blood work will reflect how he is doing on diet without medication. I advised that he should call back to schedule January appointment in the next 2 weeks. He verbalized understanding and agreement and thanked me for the call.

## 2017-07-01 ENCOUNTER — Telehealth: Payer: Self-pay | Admitting: Cardiovascular Disease

## 2017-07-01 DIAGNOSIS — E782 Mixed hyperlipidemia: Secondary | ICD-10-CM

## 2017-07-01 DIAGNOSIS — I10 Essential (primary) hypertension: Secondary | ICD-10-CM

## 2017-07-01 NOTE — Telephone Encounter (Signed)
New message  Pt verbalized that he is calling for the RN  Pt calling for labs in late March

## 2017-07-02 MED ORDER — NEBIVOLOL HCL 10 MG PO TABS: 5.0000 mg | ORAL_TABLET | Freq: Every day | ORAL | 3 refills | Status: DC

## 2017-07-02 NOTE — Telephone Encounter (Signed)
Left message for patient to call back  

## 2017-07-02 NOTE — Telephone Encounter (Signed)
Spoke with patient who called to discuss lab work prior to his office visit with Dr. Acie Fredrickson in May. In October, I talked with the patient about his lab results and elevated LDL and patient wanted to wean himself off Atorvastatin. He states he stopped the medication a while ago and follows a very healthy low cholesterol diet of mostly vegetables. He states he is feeling well and since making these dietary changes, he has noticed his BP is running lower. He reports systolic BP of 945-038 mmHg and diastolic BP of 88-28 mmHg. He reports pulse is usually 50-60 bpm. He asks if he can decrease his bystolic to 1/2 of his 10 mg tablet. I advised that he may do so and he states he will cut his pills in half until he comes in for his appointment with Dr. Acie Fredrickson. I scheduled him for lab work on 4/24 and advised him to call prior to his appointments with additional questions or concerns. He thanked me for the call.

## 2017-08-15 ENCOUNTER — Other Ambulatory Visit: Payer: Self-pay | Admitting: Cardiovascular Disease

## 2017-09-01 ENCOUNTER — Other Ambulatory Visit: Payer: BC Managed Care – PPO | Admitting: *Deleted

## 2017-09-01 DIAGNOSIS — I10 Essential (primary) hypertension: Secondary | ICD-10-CM

## 2017-09-01 DIAGNOSIS — E782 Mixed hyperlipidemia: Secondary | ICD-10-CM

## 2017-09-01 LAB — HEPATIC FUNCTION PANEL
ALBUMIN: 4.4 g/dL (ref 3.6–4.8)
ALK PHOS: 73 IU/L (ref 39–117)
ALT: 19 IU/L (ref 0–44)
AST: 20 IU/L (ref 0–40)
BILIRUBIN, DIRECT: 0.21 mg/dL (ref 0.00–0.40)
Bilirubin Total: 0.9 mg/dL (ref 0.0–1.2)
TOTAL PROTEIN: 6.6 g/dL (ref 6.0–8.5)

## 2017-09-01 LAB — BASIC METABOLIC PANEL
BUN / CREAT RATIO: 12 (ref 10–24)
BUN: 12 mg/dL (ref 8–27)
CO2: 28 mmol/L (ref 20–29)
CREATININE: 0.98 mg/dL (ref 0.76–1.27)
Calcium: 9.3 mg/dL (ref 8.6–10.2)
Chloride: 100 mmol/L (ref 96–106)
GFR calc Af Amer: 91 mL/min/{1.73_m2} (ref >59)
GFR calc non Af Amer: 78 mL/min/{1.73_m2} (ref >59)
Glucose: 98 mg/dL (ref 65–99)
POTASSIUM: 3.8 mmol/L (ref 3.5–5.2)
SODIUM: 141 mmol/L (ref 134–144)

## 2017-09-01 LAB — LIPID PANEL
Chol/HDL Ratio: 4.2 ratio (ref 0.0–5.0)
Cholesterol, Total: 200 mg/dL — ABNORMAL HIGH (ref 100–199)
HDL: 48 mg/dL (ref >39)
LDL CALC: 133 mg/dL — AB (ref 0–99)
Triglycerides: 95 mg/dL (ref 0–149)
VLDL Cholesterol Cal: 19 mg/dL (ref 5–40)

## 2017-09-07 ENCOUNTER — Encounter: Payer: Self-pay | Admitting: Cardiovascular Disease

## 2017-09-24 ENCOUNTER — Ambulatory Visit: Payer: BC Managed Care – PPO | Admitting: Cardiovascular Disease

## 2017-09-24 ENCOUNTER — Encounter: Payer: Self-pay | Admitting: Cardiovascular Disease

## 2017-09-24 VITALS — BP 124/76 | HR 74 | Ht 71.0 in | Wt 186.4 lb

## 2017-09-24 DIAGNOSIS — I10 Essential (primary) hypertension: Secondary | ICD-10-CM | POA: Diagnosis not present

## 2017-09-24 DIAGNOSIS — E782 Mixed hyperlipidemia: Secondary | ICD-10-CM | POA: Diagnosis not present

## 2017-09-24 MED ORDER — NEBIVOLOL HCL 5 MG PO TABS: 5.0000 mg | ORAL_TABLET | Freq: Every day | ORAL | 3 refills | Status: DC

## 2017-09-24 NOTE — Patient Instructions (Signed)
Your physician recommends that you continue on your current medications as directed. Please refer to the Current Medication list given to you today.  Your physician recommends that you return for lab work in: 3 months (bmet, lipids, liver)  Your physician wants you to follow-up in: 1 year with Dr. Acie Fredrickson. You will receive a reminder letter in the mail two months in advance. If you don't receive a letter, please call our office to schedule the follow-up appointment.

## 2017-09-24 NOTE — Progress Notes (Signed)
Tim James Date of Birth  September 06, 1945       [facility] Office [address] [place], [place], [place]  [address]   [place], [place]  [address] [phone]     [phone]   Fax  [phone]    Fax [phone]  Problem List: 1.  Hypertension  2.  Chest pain  3.  Fatigue 4. Hyperlipidemia   Tim James is a 72 y.o. gentleman with hx of HTN and hyperlipidemia.  He has done well.  He does not exercise as much as he would like.  He has not had any chest pain or dyspnea.  Nov.  10, 2014:  Tim James is doing well.   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    He needs to walk more.     Nov. 11, 2015:  Tim James is seen back today for follow up of his HTN and hyperlipidemia. He still teaches middle school math.  Wants to work until 57. No Cp or dyspnea.   Walks around at school.  Walks the dogs at night. Has has lost about 20 lbs since Sept.  Feels better.  Feb. 1, 2017:  Doing well.  BP is a bit high this am - some stresses this am .  Avoiding salt. Has not been exercising much , lots of left knee pain   Jan. 12, 2018:  Tim James is doing well.   Has had some chest pressure for the past month. The pain moves around , from 1 side to another, to the back Is not worsened with activity. Has a cough, has had a minor cold. Has not been walking much  Is working at a new job - now work in [place] .   Sep 24, 2017:  Tim James is seen today for follow-up of his hypertension, chest pain, and fatigue. He also has a history of hyperlipidemia.  Recent labs reveal a total cholesterol of 214.  His HDL is 53.9.  The LDL is 139.  Triglyceride level is 103.   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 AMINO ACIDS PO Take 1 tablet by mouth daily.    . Ascorbic Acid (VITAMIN C) 100 MG tablet Take 100 mg by mouth daily.      [name] aspirin 81 MG tablet Take 81 mg by mouth daily.      . B Complex-C (B-COMPLEX WITH VITAMIN C) tablet Take 1 tablet by mouth daily.    . Cholecalciferol (VITAMIN D-3 PO) Take 1,000 Units by mouth daily.    . fish oil-omega-3 fatty acids 1000 MG capsule Take 2 g by mouth daily.      . Flaxseed, Linseed, 1000 MG CAPS Take 1,000 mg by mouth daily.    [name] glucosamine-chondroitin 500-400 MG tablet Take 1 tablet by mouth daily.    . saw palmetto 160 MG capsule Take 160 mg by mouth 2 (two) times daily.      . vitamin E 400 UNIT capsule Take 400 Units by mouth daily.       No current facility-administered medications on file prior to visit.     No Known Allergies  Past Medical  History:  Diagnosis Date  . Fatigue   . Hyperlipidemia   . Hypertension     Past Surgical History:  Procedure Laterality Date  . APPENDECTOMY    . TONSILLECTOMY      Social History   Tobacco Use  Smoking Status Never Smoker  Smokeless Tobacco Never Used    Social History   Substance and Sexual Activity  Alcohol Use No    Family History  Problem Relation Age of Onset  . Hypertension Mother   . Angina Mother   . Hypertension Father   . Hypertension Sister   . Hypertension Brother   . Hypertension Sister     Reviw of Systems:  Noted in current history, otherwise review of systems is negative.   Physical Exam: Blood pressure 124/76, pulse 74, height 6\' 11"  (2.108 m), weight 186 lb 6.4 oz (84.6 kg), SpO2 99 %.  GEN:  Well nourished, well developed in no acute distress HEENT: Normal NECK: No JVD; No carotid bruits LYMPHATICS: No lymphadenopathy CARDIAC: RR  RESPIRATORY:  Clear to auscultation without rales, wheezing or rhonchi  ABDOMEN: Soft, non-tender, non-distended MUSCULOSKELETAL:  No edema; No deformity  SKIN: Warm and dry NEUROLOGIC:  Alert and oriented x  3   ECG:   Sep 24, 2017:   NSR at 40.   Normal ECG    Assessment / Plan:   1.  Hypertension  -  Blood pressures fairly well-controlled. I've advised him to continue with a good diet and exercise program. He plans on  Losing weight.  2.  Hyperlipidemia: His LDL is 139.  He wants to continue with diet and exercise.  He really does not want to take a statin.  We will recheck his lipid levels basic metabolic profile in liver enzymes in 3 months.    Mertie Moores, MD  09/24/2017 4:00 PM    [address] [place],  [place], [place]  [address] Pager [phone] Phone: [phone]; Fax: [phone]

## 2017-12-24 ENCOUNTER — Other Ambulatory Visit: Payer: BC Managed Care – PPO

## 2018-07-26 ENCOUNTER — Other Ambulatory Visit: Payer: Self-pay | Admitting: Cardiovascular Disease

## 2018-07-26 MED ORDER — NEBIVOLOL HCL 5 MG PO TABS: 5.0000 mg | ORAL_TABLET | Freq: Every day | ORAL | 0 refills | Status: DC

## 2018-07-26 NOTE — Telephone Encounter (Signed)
New Message           *STAT* If patient is at the pharmacy, call can be transferred to refill team.   1. Which medications need to be refilled? (please list name of each medication and dose if known) "Bystolic 5 mg  2. Which pharmacy/location (including street and city if local pharmacy) is medication to be sent to?[facility]   3. Do they need a 30 day or 90 day supply? [place]

## 2018-11-04 ENCOUNTER — Telehealth: Payer: Self-pay | Admitting: Cardiovascular Disease

## 2018-11-04 DIAGNOSIS — I10 Essential (primary) hypertension: Secondary | ICD-10-CM

## 2018-11-04 DIAGNOSIS — E782 Mixed hyperlipidemia: Secondary | ICD-10-CM

## 2018-11-04 NOTE — Telephone Encounter (Signed)
New Message           Patient is calling to get a lab order put in for his annual appointment. Appointment is 07/13 would like to come in next week to do labs. Pls advise.

## 2018-11-04 NOTE — Telephone Encounter (Signed)
Pt is calling to request from Dr. Acie Fredrickson and Sharyn Lull RN, if he can come and get his labs done prior to his annual follow-up visit with Dr. Acie Fredrickson on 7/13.  Pt states ideally,he would like to come in for lab sometime next week.  Informed the pt that I will most certainly route this request to Dr. Acie Fredrickson and Sharyn Lull RN to review and advise on, and someone from the office will follow-up with him shortly thereafter, once orders are received.  Pt verbalized understanding and agrees with this plan.

## 2018-11-06 NOTE — Telephone Encounter (Signed)
Please get Lipids, liver enz, bmp this week . Thanks

## 2018-11-07 NOTE — Telephone Encounter (Signed)
Left detailed message for patient that lab work is scheduled for Thursday at our office. I advised him to call back to reschedule if this is not a good date for him or to call back with questions or concerns.

## 2018-11-10 ENCOUNTER — Other Ambulatory Visit: Payer: Self-pay

## 2018-11-10 ENCOUNTER — Other Ambulatory Visit: Payer: BC Managed Care – PPO | Admitting: *Deleted

## 2018-11-10 DIAGNOSIS — E782 Mixed hyperlipidemia: Secondary | ICD-10-CM | POA: Diagnosis not present

## 2018-11-10 DIAGNOSIS — I10 Essential (primary) hypertension: Secondary | ICD-10-CM

## 2018-11-10 LAB — BASIC METABOLIC PANEL
BUN/Creatinine Ratio: 14 (ref 10–24)
BUN: 13 mg/dL (ref 8–27)
CO2: 26 mmol/L (ref 20–29)
Calcium: 9.7 mg/dL (ref 8.6–10.2)
Chloride: 99 mmol/L (ref 96–106)
Creatinine, Ser: 0.92 mg/dL (ref 0.76–1.27)
GFR calc Af Amer: 97 mL/min/{1.73_m2} (ref >59)
GFR calc non Af Amer: 84 mL/min/{1.73_m2} (ref >59)
Glucose: 91 mg/dL (ref 65–99)
Potassium: 4.2 mmol/L (ref 3.5–5.2)
Sodium: 140 mmol/L (ref 134–144)

## 2018-11-10 LAB — HEPATIC FUNCTION PANEL
ALT: 16 IU/L (ref 0–44)
AST: 19 IU/L (ref 0–40)
Albumin: 4.7 g/dL (ref 3.8–4.8)
Alkaline Phosphatase: 74 IU/L (ref 39–117)
Bilirubin Total: 0.8 mg/dL (ref 0.0–1.2)
Bilirubin, Direct: 0.17 mg/dL (ref 0.00–0.40)
Total Protein: 7.1 g/dL (ref 6.0–8.5)

## 2018-11-10 LAB — LIPID PANEL
Chol/HDL Ratio: 4.2 ratio (ref 0.0–5.0)
Cholesterol, Total: 229 mg/dL — ABNORMAL HIGH (ref 100–199)
HDL: 54 mg/dL (ref >39)
LDL Calculated: 155 mg/dL — ABNORMAL HIGH (ref 0–99)
Triglycerides: 100 mg/dL (ref 0–149)
VLDL Cholesterol Cal: 20 mg/dL (ref 5–40)

## 2018-11-16 ENCOUNTER — Other Ambulatory Visit: Payer: Self-pay | Admitting: *Deleted

## 2018-11-16 DIAGNOSIS — Z20822 Contact with and (suspected) exposure to covid-19: Secondary | ICD-10-CM

## 2018-11-16 DIAGNOSIS — R350 Frequency of micturition: Secondary | ICD-10-CM | POA: Diagnosis not present

## 2018-11-16 DIAGNOSIS — R319 Hematuria, unspecified: Secondary | ICD-10-CM | POA: Diagnosis not present

## 2018-11-18 ENCOUNTER — Telehealth: Payer: Self-pay | Admitting: Cardiovascular Disease

## 2018-11-18 NOTE — Telephone Encounter (Signed)
New Message         COVID-19 Pre-Screening Questions:   In the past 7 to 10 days have you had a cough,  shortness of breath, headache, congestion, fever (100 or greater) body aches, chills, sore throat, or sudden loss of taste or sense of smell?  YES, Headache, chills and fever. Tuesday Night.  Wednesday morning had COVID test done and had blood in his urine, he had his urine sample tested and is on antibiotics for what they believe is a UTI   Have you been around anyone with known Covid 19. NO  Have you been around anyone who is awaiting Covid 19 test results in the past 7 to 10 days? Pt  And his wife had covid test on Wednesday and theyre waiting for results   Have you been around anyone who has been exposed to Covid 19, or has mentioned symptoms of Covid 19 within the past 7 to 10 days? NO  If you have any concerns/questions about symptoms patients report during screening (either on the phone or at threshold). Contact the provider seeing the patient or DOD for further guidance.  If neither are available contact a member of the leadership team.

## 2018-11-18 NOTE — Telephone Encounter (Signed)

## 2018-11-18 NOTE — Telephone Encounter (Signed)
Pt has upcoming apt with Dr. Acie Fredrickson 11/21/18 and he was screened for an in person appt and he has reported that he had a fever this week 11/15/18.. he has been seen by his PCP and determined to possibly have a UTI. He also had precautionary COVID testing done along with his wife and they are waiting for the results if his COVID test and Urine culture.. he has been on antibiotics and has not had a fever since then.. no other COVID sx but he was originally planning to have the visit be Virtual and he reports he feels comfortable with it being "not in person" he has a list of his BP readings, weight, and meds ready for Monday and will continue to monitor BP over the weekend. Pt OV changed to virtual per his request.    Will forward to Dr. Dahlia Client for review.   Pt has a smart phone.

## 2018-11-21 ENCOUNTER — Telehealth (INDEPENDENT_AMBULATORY_CARE_PROVIDER_SITE_OTHER): Payer: Medicare HMO | Admitting: Cardiovascular Disease

## 2018-11-21 ENCOUNTER — Other Ambulatory Visit: Payer: Self-pay

## 2018-11-21 ENCOUNTER — Encounter: Payer: Self-pay | Admitting: Cardiovascular Disease

## 2018-11-21 VITALS — BP 123/77 | HR 64 | Ht 71.5 in | Wt 188.4 lb

## 2018-11-21 DIAGNOSIS — E782 Mixed hyperlipidemia: Secondary | ICD-10-CM | POA: Diagnosis not present

## 2018-11-21 DIAGNOSIS — Z7189 Other specified counseling: Secondary | ICD-10-CM | POA: Insufficient documentation

## 2018-11-21 LAB — NOVEL CORONAVIRUS, NAA: SARS-CoV-2, NAA: NOT DETECTED

## 2018-11-21 NOTE — Patient Instructions (Signed)
Medication Instructions:  Your physician recommends that you continue on your current medications as directed. Please refer to the Current Medication list given to you today.  If you need a refill on your cardiac medications before your next appointment, please call your pharmacy.    Lab work: None Ordered    Testing/Procedures: CT scanning (CAT scanning) for calcium scoring, is a noninvasive, special x-ray that produces cross-sectional images of the body using x-rays and a computer. CT scans help physicians diagnose and treat medical conditions. For some CT exams, a contrast material is used to enhance visibility in the area of the body being studied. CT scans provide greater clarity and reveal more details than regular x-ray exams.    Follow-Up: At [facility], you and your health needs are our priority.  As part of our continuing mission to provide you with exceptional heart care, we have created designated Provider Care Teams.  These Care Teams include your primary Cardiologist (physician) and Advanced Practice Providers (APPs -  Physician Assistants and Nurse Practitioners) who all work together to provide you with the care you need, when you need it. You will need a follow up appointment in:  1 years.  Please call our office 2 months in advance to schedule this appointment.  You may see Dr. Acie Fredrickson or one of the following Advanced Practice Providers on your designated Care Team: Richardson Dopp, PA-C [place], [place] . Daune Perch, NP

## 2018-11-21 NOTE — Progress Notes (Signed)
.    Virtual Visit via Video Note   This visit type was conducted due to national recommendations for restrictions regarding the COVID-19 Pandemic (e.g. social distancing) in an effort to limit this patient's exposure and mitigate transmission in our community.  Due to his co-morbid illnesses, this patient is at least at moderate risk for complications without adequate follow up.  This format is felt to be most appropriate for this patient at this time.  All issues noted in this document were discussed and addressed.  A limited physical exam was performed with this format.  Please refer to the patient's chart for his consent to telehealth for [facility]   Date:  11/21/2018   ID:  Tim James, DOB 1945-08-04, MRN [id number]  Patient Location: Home Provider Location: Office  PCP:  Lawerance Cruel, MD  Cardiologist:  Zaiyah Sottile  Electrophysiologist:  None   Evaluation Performed:  Follow-Up Visit  Problem List: 1.  Hypertension  2.  Chest pain  3.  Fatigue 4. Hyperlipidemia   Tim James is a 73 y.o. gentleman with hx of HTN and hyperlipidemia.  He has done well.  He does not exercise as much as he would like.  He has not had any chest pain or dyspnea.  Nov.  10, 2014:  Tim James is doing well.   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    He needs to walk more.     Nov. 11, 2015:  Tim James is seen back today for follow up of his HTN and hyperlipidemia. He still teaches middle school math.  Wants to work until 28. No Cp or dyspnea.   Walks around at school.  Walks the dogs at night. Has has lost about 20 lbs since Sept.  Feels better.  Feb. 1, 2017:  Doing well.  BP is a bit high this am - some stresses this am .  Avoiding salt. Has not been exercising much , lots of left knee pain   Jan. 12, 2018:  Davell is doing well.   Has had some chest pressure for the past month. The pain moves around , from 1 side to another, to the back Is not  worsened with activity. Has a cough, has had a minor cold. Has not been walking much  Is working at a new job - now work in [place] .   Sep 24, 2017:  Tim James is seen today for follow-up of his hypertension, chest pain, and fatigue. He also has a history of hyperlipidemia.  Recent labs reveal a total cholesterol of 214.  His HDL is 53.9.  The LDL is 139.  Triglyceride level is 103.   Working on diet and exercise ,   Bought an elliptical work out machine, not using it much   He was on Atorvastatin and Simvastatin in the past  Teaches 8th grade math    Chief Complaint:  Hyperlipidemia,    History of Present Illness:    Tim James is a 73 y.o. male with hyperlipidemia .    Has had some hematuria .  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   Lipids are still elevated.  Tolerates statins but has chosen not to take it  Generally exercises 1-2 huors a day .     The patient does not have symptoms concerning for COVID-19 infection (fever, chills, cough, or new shortness of breath).    Past Medical History:  Diagnosis Date  . Fatigue   .  Hyperlipidemia   . Hypertension    Past Surgical History:  Procedure Laterality Date  . APPENDECTOMY    . TONSILLECTOMY       Current Meds  Medication Sig  . AMINO ACIDS PO Take 1 tablet by mouth daily.  . Ascorbic Acid (VITAMIN C) 100 MG tablet Take 100 mg by mouth daily.    [name] aspirin 81 MG tablet Take 81 mg by mouth daily.    . B Complex-C (B-COMPLEX WITH VITAMIN C) tablet Take 1 tablet by mouth daily.  . Cholecalciferol (VITAMIN D-3 PO) Take 1,000 Units by mouth daily.  . fish oil-omega-3 fatty acids 1000 MG capsule Take 2 g by mouth daily.    . Flaxseed, Linseed, 1000 MG CAPS Take 1,000 mg by mouth daily.  [name] glucosamine-chondroitin 500-400 MG tablet Take 2 tablets by mouth daily.   . nebivolol (BYSTOLIC) 5 MG tablet Take 1 tablet (5 mg total) by mouth daily. Please make annual appt for future refills. Thank you  .  saw palmetto 160 MG capsule Take 160 mg by mouth 2 (two) times daily.    . vitamin E 400 UNIT capsule Take 400 Units by mouth daily.       Allergies:   Patient has no known allergies.   Social History   Tobacco Use  . Smoking status: Never Smoker  . Smokeless tobacco: Never Used  Substance Use Topics  . Alcohol use: No  . Drug use: No     Family Hx: The patient's family history includes Angina in his mother; Hypertension in his brother, father, mother, sister, and sister.  ROS:   Please see the history of present illness.     All other systems reviewed and are negative.   Prior CV studies:   The following studies were reviewed today:    Labs/Other Tests and Data Reviewed:    EKG:  No ECG reviewed.  Recent Labs: 11/10/2018: ALT 16; BUN 13; Creatinine, Ser 0.92; Potassium 4.2; Sodium 140   Recent Lipid Panel Lab Results  Component Value Date/Time   CHOL 229 (H) 11/10/2018 09:30 AM   TRIG 100 11/10/2018 09:30 AM   HDL 54 11/10/2018 09:30 AM   CHOLHDL 4.2 11/10/2018 09:30 AM   CHOLHDL 2.9 06/12/2015 09:05 AM   LDLCALC 155 (H) 11/10/2018 09:30 AM    Wt Readings from Last 3 Encounters:  11/21/18 188 lb 6.4 oz (85.5 kg)  09/24/17 186 lb 6.4 oz (84.6 kg)  05/22/16 220 lb 12.8 oz (100.2 kg)     Objective:    Vital Signs:  BP 123/77   Pulse 64   Ht 5' 11.5" (1.816 m)   Wt 188 lb 6.4 oz (85.5 kg)   BMI 25.91 kg/m    VITAL SIGNS:  reviewed GEN:  no acute distress EYES:  sclerae anicteric, EOMI - Extraocular Movements Intact RESPIRATORY:  normal respiratory effort, symmetric expansion CARDIOVASCULAR:  no peripheral edema SKIN:  no rash, lesions or ulcers. MUSCULOSKELETAL:  no obvious deformities. NEURO:  alert and oriented x 3, no obvious focal deficit PSYCH:  normal affect  ASSESSMENT & PLAN:    1.  Hyperlipidemia:    Will get a coronary calcium score.    He is willing to take a statin or PCSK -9 inhibitor   Office visit in 1 year   COVID-19 Education:  The signs and symptoms of COVID-19 were discussed with the patient and how to seek care for testing (follow up with PCP or arrange E-visit).  The importance  of social distancing was discussed today.  Time:   Today, I have spent  19  minutes with the patient with telehealth technology discussing the above problems.     Medication Adjustments/Labs and Tests Ordered: Current medicines are reviewed at length with the patient today.  Concerns regarding medicines are outlined above.   Tests Ordered: No orders of the defined types were placed in this encounter.   Medication Changes: No orders of the defined types were placed in this encounter.   Follow Up:  Virtual Visit or In Person in 1 year(s)  Signed, Mertie Moores, MD  11/21/2018 9:26 AM    [place]

## 2018-12-13 DIAGNOSIS — R350 Frequency of micturition: Secondary | ICD-10-CM | POA: Diagnosis not present

## 2018-12-13 DIAGNOSIS — R3915 Urgency of urination: Secondary | ICD-10-CM | POA: Diagnosis not present

## 2018-12-27 ENCOUNTER — Ambulatory Visit (INDEPENDENT_AMBULATORY_CARE_PROVIDER_SITE_OTHER)
Admission: RE | Admit: 2018-12-27 | Discharge: 2018-12-27 | Disposition: A | Discharge status: Home / Self Care | Payer: Self-pay | Source: Ambulatory Visit | Attending: Cardiovascular Disease | Admitting: Cardiovascular Disease

## 2018-12-27 ENCOUNTER — Other Ambulatory Visit: Payer: Self-pay

## 2018-12-27 DIAGNOSIS — E782 Mixed hyperlipidemia: Secondary | ICD-10-CM

## 2018-12-29 ENCOUNTER — Telehealth: Payer: Self-pay | Admitting: *Deleted

## 2018-12-29 DIAGNOSIS — E782 Mixed hyperlipidemia: Secondary | ICD-10-CM

## 2018-12-29 DIAGNOSIS — Z79899 Other long term (current) drug therapy: Secondary | ICD-10-CM

## 2018-12-29 MED ORDER — ROSUVASTATIN CALCIUM 10 MG PO TABS: 10.0000 mg | ORAL_TABLET | Freq: Every day | ORAL | 3 refills | Status: DC

## 2018-12-29 NOTE — Telephone Encounter (Signed)
-----   Message from Thayer Headings, MD sent at 12/27/2018  1:05 PM EDT ----- Coronary artery calcium score is 634 (78th percentile for age / sex matched controls)  He needs to start back taking his statin I would suggest rosuvastatin 10 mg a day .  Check lipids, liver enz, and BMP in 3 months

## 2019-01-11 DIAGNOSIS — R69 Illness, unspecified: Secondary | ICD-10-CM | POA: Diagnosis not present

## 2019-01-26 DIAGNOSIS — L821 Other seborrheic keratosis: Secondary | ICD-10-CM | POA: Diagnosis not present

## 2019-01-26 DIAGNOSIS — L812 Freckles: Secondary | ICD-10-CM | POA: Diagnosis not present

## 2019-01-26 DIAGNOSIS — D1801 Hemangioma of skin and subcutaneous tissue: Secondary | ICD-10-CM | POA: Diagnosis not present

## 2019-03-06 ENCOUNTER — Other Ambulatory Visit: Payer: Self-pay | Admitting: Cardiovascular Disease

## 2019-03-08 ENCOUNTER — Other Ambulatory Visit: Payer: Self-pay | Admitting: Cardiovascular Disease

## 2019-03-08 MED ORDER — NEBIVOLOL HCL 5 MG PO TABS: 5.0000 mg | ORAL_TABLET | Freq: Every day | ORAL | 2 refills | Status: DC

## 2019-03-08 NOTE — Telephone Encounter (Signed)
Pt's medication was sent to pt's pharmacy as requested. Confirmation received.  °

## 2019-03-21 DIAGNOSIS — S40012A Contusion of left shoulder, initial encounter: Secondary | ICD-10-CM | POA: Diagnosis not present

## 2019-03-23 DIAGNOSIS — R7303 Prediabetes: Secondary | ICD-10-CM | POA: Diagnosis not present

## 2019-03-23 DIAGNOSIS — Z0000 Encounter for general adult medical examination without abnormal findings: Secondary | ICD-10-CM | POA: Diagnosis not present

## 2019-03-23 DIAGNOSIS — I10 Essential (primary) hypertension: Secondary | ICD-10-CM | POA: Diagnosis not present

## 2019-03-31 ENCOUNTER — Other Ambulatory Visit: Payer: Self-pay

## 2019-03-31 ENCOUNTER — Other Ambulatory Visit: Payer: Medicare HMO | Admitting: *Deleted

## 2019-03-31 DIAGNOSIS — I10 Essential (primary) hypertension: Secondary | ICD-10-CM | POA: Diagnosis not present

## 2019-03-31 DIAGNOSIS — Z125 Encounter for screening for malignant neoplasm of prostate: Secondary | ICD-10-CM | POA: Diagnosis not present

## 2019-03-31 DIAGNOSIS — Z79899 Other long term (current) drug therapy: Secondary | ICD-10-CM | POA: Diagnosis not present

## 2019-03-31 DIAGNOSIS — E782 Mixed hyperlipidemia: Secondary | ICD-10-CM | POA: Diagnosis not present

## 2019-03-31 DIAGNOSIS — Z0000 Encounter for general adult medical examination without abnormal findings: Secondary | ICD-10-CM | POA: Diagnosis not present

## 2019-03-31 DIAGNOSIS — R7303 Prediabetes: Secondary | ICD-10-CM | POA: Diagnosis not present

## 2019-03-31 LAB — HEPATIC FUNCTION PANEL
ALT: 18 IU/L (ref 0–44)
AST: 20 IU/L (ref 0–40)
Albumin: 4.4 g/dL (ref 3.7–4.7)
Alkaline Phosphatase: 82 IU/L (ref 39–117)
Bilirubin Total: 0.7 mg/dL (ref 0.0–1.2)
Bilirubin, Direct: 0.19 mg/dL (ref 0.00–0.40)
Total Protein: 7.1 g/dL (ref 6.0–8.5)

## 2019-03-31 LAB — LIPID PANEL
Chol/HDL Ratio: 3.2 ratio (ref 0.0–5.0)
Cholesterol, Total: 153 mg/dL (ref 100–199)
HDL: 48 mg/dL (ref >39)
LDL Chol Calc (NIH): 87 mg/dL (ref 0–99)
Triglycerides: 97 mg/dL (ref 0–149)
VLDL Cholesterol Cal: 18 mg/dL (ref 5–40)

## 2019-04-04 ENCOUNTER — Telehealth: Payer: Self-pay | Admitting: Cardiovascular Disease

## 2019-04-04 NOTE — Telephone Encounter (Signed)
Left message for patient to call back regarding medications. Will offer potential help with cost of Bystolic or offer alternatives.  Labs have been faxed to PCP per patient request

## 2019-04-04 NOTE — Telephone Encounter (Signed)
New Message    Pt c/o medication issue:  1. Name of Medication: nebivolol (BYSTOLIC) 5 MG tablet  rosuvastatin (CRESTOR) 10 MG tablet(Expired)  2. How are you currently taking this medication (dosage and times per day)? Bystolic 5mg  1x daily Crestor 10mg  1x daily   3. Are you having a reaction (difficulty breathing--STAT)? No  4. What is your medication issue? Bystolic, is costly. Pt has changed insurance and is wanting to replace this medication with something for cost reasons Crestor, he says he is taking 10mg  daily and is looking to go back to 5mg  daily    Pt would also like his las lab results to be faxed to his PCP Loura Back at [facility] [employer] number [phone]   Please advise

## 2019-04-05 MED ORDER — ROSUVASTATIN CALCIUM 10 MG PO TABS: 10.0000 mg | ORAL_TABLET | Freq: Every day | ORAL | 3 refills | Status: DC

## 2019-04-05 MED ORDER — CARVEDILOL 12.5 MG PO TABS: 12.5000 mg | ORAL_TABLET | Freq: Two times a day (BID) | ORAL | 11 refills | Status: DC

## 2019-04-05 NOTE — Telephone Encounter (Signed)
Follow Up: ° ° ° °Returning your call from yesterday. °

## 2019-04-05 NOTE — Telephone Encounter (Signed)
Spoke with patient and advised there are alternative generic medications to replace Bystolic or we can try to get help for him for the cost of Bystolic. He states he would like to try a different medication and if it does not work well, he may want to go back to [employer]. Per Dr. Acie Fredrickson, will start carvedilol 12.5 mg twice daily.  He would also like to know if he can take rosuvastatin 5 mg instead of 10 mg. I advised that per Dr. Acie Fredrickson, he would like him to continue 10 mg due to his elevated calcium score unless he is having side effects and cannot tolerate the dose. He states he has some pain in his lower arms but he is being evaluated for arthritis. I advised that if pain worsens or becomes intolerable to call back and we will decrease dose. Patient verbalized understanding and agreement with plan and thanked me for the call.

## 2019-04-21 DIAGNOSIS — M2550 Pain in unspecified joint: Secondary | ICD-10-CM | POA: Diagnosis not present

## 2019-06-19 ENCOUNTER — Ambulatory Visit: Payer: Medicare HMO

## 2019-07-07 ENCOUNTER — Ambulatory Visit: Payer: Medicare HMO

## 2019-09-29 DIAGNOSIS — Z1159 Encounter for screening for other viral diseases: Secondary | ICD-10-CM | POA: Diagnosis not present

## 2019-10-04 DIAGNOSIS — D120 Benign neoplasm of cecum: Secondary | ICD-10-CM | POA: Diagnosis not present

## 2019-10-04 DIAGNOSIS — K635 Polyp of colon: Secondary | ICD-10-CM | POA: Diagnosis not present

## 2019-10-04 DIAGNOSIS — K640 First degree hemorrhoids: Secondary | ICD-10-CM | POA: Diagnosis not present

## 2019-10-04 DIAGNOSIS — K5730 Diverticulosis of large intestine without perforation or abscess without bleeding: Secondary | ICD-10-CM | POA: Diagnosis not present

## 2019-10-04 DIAGNOSIS — Z86010 Personal history of colonic polyps: Secondary | ICD-10-CM | POA: Diagnosis not present

## 2019-10-06 DIAGNOSIS — K635 Polyp of colon: Secondary | ICD-10-CM | POA: Diagnosis not present

## 2019-10-06 DIAGNOSIS — D120 Benign neoplasm of cecum: Secondary | ICD-10-CM | POA: Diagnosis not present

## 2019-11-21 DIAGNOSIS — M79602 Pain in left arm: Secondary | ICD-10-CM | POA: Diagnosis not present

## 2019-12-05 DIAGNOSIS — M25512 Pain in left shoulder: Secondary | ICD-10-CM | POA: Diagnosis not present

## 2019-12-05 DIAGNOSIS — M25612 Stiffness of left shoulder, not elsewhere classified: Secondary | ICD-10-CM | POA: Diagnosis not present

## 2019-12-05 DIAGNOSIS — M25511 Pain in right shoulder: Secondary | ICD-10-CM | POA: Diagnosis not present

## 2019-12-05 DIAGNOSIS — R293 Abnormal posture: Secondary | ICD-10-CM | POA: Diagnosis not present

## 2019-12-08 DIAGNOSIS — M25511 Pain in right shoulder: Secondary | ICD-10-CM | POA: Diagnosis not present

## 2019-12-08 DIAGNOSIS — R293 Abnormal posture: Secondary | ICD-10-CM | POA: Diagnosis not present

## 2019-12-08 DIAGNOSIS — M25512 Pain in left shoulder: Secondary | ICD-10-CM | POA: Diagnosis not present

## 2019-12-08 DIAGNOSIS — M25612 Stiffness of left shoulder, not elsewhere classified: Secondary | ICD-10-CM | POA: Diagnosis not present

## 2019-12-18 ENCOUNTER — Ambulatory Visit: Payer: Medicare HMO | Admitting: Cardiovascular Disease

## 2019-12-19 DIAGNOSIS — R293 Abnormal posture: Secondary | ICD-10-CM | POA: Diagnosis not present

## 2019-12-19 DIAGNOSIS — M25512 Pain in left shoulder: Secondary | ICD-10-CM | POA: Diagnosis not present

## 2019-12-19 DIAGNOSIS — M25612 Stiffness of left shoulder, not elsewhere classified: Secondary | ICD-10-CM | POA: Diagnosis not present

## 2019-12-19 DIAGNOSIS — M25511 Pain in right shoulder: Secondary | ICD-10-CM | POA: Diagnosis not present

## 2019-12-21 ENCOUNTER — Telehealth: Payer: Self-pay | Admitting: Cardiovascular Disease

## 2019-12-21 DIAGNOSIS — M25512 Pain in left shoulder: Secondary | ICD-10-CM | POA: Diagnosis not present

## 2019-12-21 DIAGNOSIS — Z20828 Contact with and (suspected) exposure to other viral communicable diseases: Secondary | ICD-10-CM | POA: Diagnosis not present

## 2019-12-21 DIAGNOSIS — E7800 Pure hypercholesterolemia, unspecified: Secondary | ICD-10-CM

## 2019-12-21 DIAGNOSIS — M25511 Pain in right shoulder: Secondary | ICD-10-CM | POA: Diagnosis not present

## 2019-12-21 DIAGNOSIS — M25612 Stiffness of left shoulder, not elsewhere classified: Secondary | ICD-10-CM | POA: Diagnosis not present

## 2019-12-21 DIAGNOSIS — R293 Abnormal posture: Secondary | ICD-10-CM | POA: Diagnosis not present

## 2019-12-21 DIAGNOSIS — I10 Essential (primary) hypertension: Secondary | ICD-10-CM

## 2019-12-21 NOTE — Telephone Encounter (Signed)
Spoke with pt and appointment scheduled for labs.  Orders placed and linked to appointment.

## 2019-12-21 NOTE — Telephone Encounter (Signed)
New message    Pt has a yearly follow up on 01-22-20.  He would like to get his blood work prior to appt so that they can discuss results at his appointment. Need order for labs.  Please call and let patient know if he can come in prior to appt.

## 2019-12-21 NOTE — Telephone Encounter (Signed)
Spoke with Tim James and advised will send his request to Dr Acie Fredrickson and contact Tim James once response is received.  Tim James verbalizes understanding and agrees with current plan.

## 2019-12-21 NOTE — Telephone Encounter (Signed)
Lets get  Lipid profile Liver enz BMP   OK to get a week or so early if patient would like

## 2019-12-26 ENCOUNTER — Other Ambulatory Visit: Payer: Self-pay

## 2019-12-26 ENCOUNTER — Other Ambulatory Visit: Payer: Medicare HMO

## 2019-12-26 DIAGNOSIS — Z20822 Contact with and (suspected) exposure to covid-19: Secondary | ICD-10-CM

## 2019-12-27 LAB — NOVEL CORONAVIRUS, NAA: SARS-CoV-2, NAA: NOT DETECTED

## 2019-12-27 LAB — SARS-COV-2, NAA 2 DAY TAT

## 2020-01-03 DIAGNOSIS — R293 Abnormal posture: Secondary | ICD-10-CM | POA: Diagnosis not present

## 2020-01-03 DIAGNOSIS — M25512 Pain in left shoulder: Secondary | ICD-10-CM | POA: Diagnosis not present

## 2020-01-03 DIAGNOSIS — M25612 Stiffness of left shoulder, not elsewhere classified: Secondary | ICD-10-CM | POA: Diagnosis not present

## 2020-01-03 DIAGNOSIS — M25511 Pain in right shoulder: Secondary | ICD-10-CM | POA: Diagnosis not present

## 2020-01-16 ENCOUNTER — Ambulatory Visit: Payer: Medicare HMO | Admitting: Cardiovascular Disease

## 2020-01-17 ENCOUNTER — Other Ambulatory Visit: Payer: Self-pay

## 2020-01-17 ENCOUNTER — Other Ambulatory Visit: Payer: Medicare HMO | Admitting: *Deleted

## 2020-01-17 DIAGNOSIS — R293 Abnormal posture: Secondary | ICD-10-CM | POA: Diagnosis not present

## 2020-01-17 DIAGNOSIS — M25511 Pain in right shoulder: Secondary | ICD-10-CM | POA: Diagnosis not present

## 2020-01-17 DIAGNOSIS — E7800 Pure hypercholesterolemia, unspecified: Secondary | ICD-10-CM | POA: Diagnosis not present

## 2020-01-17 DIAGNOSIS — M25512 Pain in left shoulder: Secondary | ICD-10-CM | POA: Diagnosis not present

## 2020-01-17 DIAGNOSIS — M25612 Stiffness of left shoulder, not elsewhere classified: Secondary | ICD-10-CM | POA: Diagnosis not present

## 2020-01-17 DIAGNOSIS — I10 Essential (primary) hypertension: Secondary | ICD-10-CM | POA: Diagnosis not present

## 2020-01-17 LAB — HEPATIC FUNCTION PANEL
ALT: 17 IU/L (ref 0–44)
AST: 18 IU/L (ref 0–40)
Albumin: 4.3 g/dL (ref 3.7–4.7)
Alkaline Phosphatase: 73 IU/L (ref 48–121)
Bilirubin Total: 0.8 mg/dL (ref 0.0–1.2)
Bilirubin, Direct: 0.24 mg/dL (ref 0.00–0.40)
Total Protein: 6.7 g/dL (ref 6.0–8.5)

## 2020-01-17 LAB — LIPID PANEL
Chol/HDL Ratio: 2.5 ratio (ref 0.0–5.0)
Cholesterol, Total: 125 mg/dL (ref 100–199)
HDL: 51 mg/dL (ref >39)
LDL Chol Calc (NIH): 60 mg/dL (ref 0–99)
Triglycerides: 67 mg/dL (ref 0–149)
VLDL Cholesterol Cal: 14 mg/dL (ref 5–40)

## 2020-01-17 LAB — BASIC METABOLIC PANEL
BUN/Creatinine Ratio: 13 (ref 10–24)
BUN: 11 mg/dL (ref 8–27)
CO2: 26 mmol/L (ref 20–29)
Calcium: 9.4 mg/dL (ref 8.6–10.2)
Chloride: 102 mmol/L (ref 96–106)
Creatinine, Ser: 0.84 mg/dL (ref 0.76–1.27)
GFR calc Af Amer: 101 mL/min/{1.73_m2} (ref >59)
GFR calc non Af Amer: 87 mL/min/{1.73_m2} (ref >59)
Glucose: 97 mg/dL (ref 65–99)
Potassium: 4.1 mmol/L (ref 3.5–5.2)
Sodium: 141 mmol/L (ref 134–144)

## 2020-01-18 ENCOUNTER — Other Ambulatory Visit: Payer: Medicare HMO

## 2020-01-22 ENCOUNTER — Other Ambulatory Visit: Payer: Self-pay

## 2020-01-22 ENCOUNTER — Encounter: Payer: Self-pay | Admitting: Cardiovascular Disease

## 2020-01-22 ENCOUNTER — Ambulatory Visit: Payer: Medicare HMO | Admitting: Cardiovascular Disease

## 2020-01-22 VITALS — BP 128/70 | HR 62 | Ht 71.5 in | Wt 192.0 lb

## 2020-01-22 DIAGNOSIS — E782 Mixed hyperlipidemia: Secondary | ICD-10-CM | POA: Diagnosis not present

## 2020-01-22 DIAGNOSIS — I10 Essential (primary) hypertension: Secondary | ICD-10-CM

## 2020-01-22 NOTE — Progress Notes (Signed)
Forrest Moron Date of Birth  02/25/1946       [facility]    [employer] [address], [address], [place], [place]  [address]   [place], [place]  [address] [phone]     [phone]   Fax  [phone]    Fax [phone]  Problem List: 1.  Hypertension  2.  Chest pain  3.  Fatigue 4. Hyperlipidemia   Spencer is a 74 y.o. gentleman with hx of HTN and hyperlipidemia.  He has done well.  He does not exercise as much as he would like.  He has not had any chest pain or dyspnea.  Nov.  10, 2014:  Oswell is doing well.   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    He needs to walk more.     Nov. 11, 2015:  Jorje is seen back today for follow up of his HTN and hyperlipidemia. He still teaches middle school math.  Wants to work until 39. No Cp or dyspnea.   Walks around at school.  Walks the dogs at night. Has has lost about 20 lbs since Sept.  Feels better.  Feb. 1, 2017:  Doing well.  BP is a bit high this am - some stresses this am .  Avoiding salt. Has not been exercising much , lots of left knee pain   Jan. 12, 2018:  Jaloni is doing well.   Has had some chest pressure for the past month. The pain moves around , from 1 side to another, to the back Is not worsened with activity. Has a cough, has had a minor cold. Has not been walking much  Is working at a new job - now work in [place] .   Sep 24, 2017:  Hamilton is seen today for follow-up of his hypertension, chest pain, and fatigue. He also has a history of hyperlipidemia.  Recent labs reveal a total cholesterol of 214.  His HDL is 53.9.  The LDL is 139.  Triglyceride level is 103.   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 13, 2021:  Delmon is seen today for follow up of his HTN, HLD .    Staying active ,  Has retired complely now.   Stays busy Has a place near [facility].  Labs from last week are reviewed and look great.  Has had both covid vaccines.   Wife and daughter had break through covid months after getting vaccines.   Current Outpatient Medications on File Prior to Visit  Medication Sig Dispense Refill  . AMINO ACIDS PO Take 1 tablet by mouth daily.    . Ascorbic Acid (VITAMIN C) 100 MG tablet Take 100 mg by mouth daily.      [name] aspirin 81 MG tablet Take 81 mg by mouth daily.      . B Complex-C (B-COMPLEX WITH VITAMIN C) tablet Take 1 tablet by mouth daily.    . Cholecalciferol (VITAMIN D-3 PO) Take 1,000 Units by mouth daily.    [name] EPINEPHrine 0.3 mg/0.3 mL IJ SOAJ injection Inject 1 Dose into the muscle as needed.    . fish oil-omega-3 fatty acids 1000 MG capsule Take 2 g by mouth daily.      [name]  Flaxseed, Linseed, 1000 MG CAPS Take 1,000 mg by mouth daily.    [name] glucosamine-chondroitin 500-400 MG tablet Take 2 tablets by mouth daily.     . rosuvastatin (CRESTOR) 10 MG tablet Take 1 tablet (10 mg total) by mouth daily. 90 tablet 3  . saw palmetto 160 MG capsule Take 160 mg by mouth 2 (two) times daily.      . vitamin E 400 UNIT capsule Take 400 Units by mouth daily.      . carvedilol (COREG) 12.5 MG tablet Take 1 tablet (12.5 mg total) by mouth 2 (two) times daily. 60 tablet 11   No current facility-administered medications on file prior to visit.    No Known Allergies  Past Medical History:  Diagnosis Date  . Fatigue   . Hyperlipidemia   . Hypertension     Past Surgical History:  Procedure Laterality Date  . APPENDECTOMY    . TONSILLECTOMY      Social History   Tobacco Use  Smoking Status Never Smoker  Smokeless Tobacco Never Used    Social History   Substance and Sexual Activity  Alcohol Use No    Family History  Problem Relation Age of Onset  . Hypertension Mother   . Angina Mother   . Hypertension Father   .  Hypertension Sister   . Hypertension Brother   . Hypertension Sister     Reviw of Systems:  Noted in current history, otherwise review of systems is negative.   Physical Exam: Blood pressure 128/70, pulse 62, height 5' 11.5" (1.816 m), weight 192 lb (87.1 kg), SpO2 98 %.  GEN:  Well nourished, well developed in no acute distress HEENT: Normal NECK: No JVD; No carotid bruits LYMPHATICS: No lymphadenopathy CARDIAC: RRR , no murmurs, rubs, gallops RESPIRATORY:  Clear to auscultation without rales, wheezing or rhonchi  ABDOMEN: Soft, non-tender, non-distended MUSCULOSKELETAL:  No edema; No deformity  SKIN: Warm and dry NEUROLOGIC:  Alert and oriented x 3  EKG: January 22, 2020: Normal sinus rhythm at 62.  No ST or T wave changes.  Assessment / Plan:   1.  Hypertension  -blood pressure is well controlled.  Continue current medications.  2.  Hyperlipidemia:  -Labs were reviewed.  Continue current medications.  He is getting fair amount of exercise.  [place] has [place] near [place], [place].  [place] to [place] [facility].  He has been enjoying getting out hiking.  Mertie Moores, MD  01/22/2020 10:39 AM    [place],  [place], [place]  [address] Pager [phone] Phone: [phone]; Fax: [phone]

## 2020-01-22 NOTE — Patient Instructions (Signed)
Medication Instructions:  Your physician recommends that you continue on your current medications as directed. Please refer to the Current Medication list given to you today.  *If you need a refill on your cardiac medications before your next appointment, please call your pharmacy*   Lab Work: None  If you have labs (blood work) drawn today and your tests are completely normal, you will receive your results only by: . MyChart Message (if you have MyChart) OR . A paper copy in the mail If you have any lab test that is abnormal or we need to change your treatment, we will call you to review the results.   Testing/Procedures: None   Follow-Up: At [facility], you and your health needs are our priority.  As part of our continuing mission to provide you with exceptional heart care, we have created designated Provider Care Teams.  These Care Teams include your primary Cardiologist (physician) and Advanced Practice Providers (APPs -  Physician Assistants and Nurse Practitioners) who all work together to provide you with the care you need, when you need it.  We recommend signing up for the patient portal called "MyChart".  Sign up information is provided on this After Visit Summary.  MyChart is used to connect with patients for Virtual Visits (Telemedicine).  Patients are able to view lab/test results, encounter notes, upcoming appointments, etc.  Non-urgent messages can be sent to your provider as well.   To learn more about what you can do with MyChart, go to [email or web address].    Your next appointment:   12 month(s)  The format for your next appointment:   In Person  Provider:   You may see Philip Nahser, MD or one of the following Advanced Practice Providers on your designated Care Team:    Scott Weaver, PA-C  Vin Bhagat, PA-C    Other Instructions None  

## 2020-01-24 DIAGNOSIS — R293 Abnormal posture: Secondary | ICD-10-CM | POA: Diagnosis not present

## 2020-01-24 DIAGNOSIS — M25511 Pain in right shoulder: Secondary | ICD-10-CM | POA: Diagnosis not present

## 2020-01-24 DIAGNOSIS — M25512 Pain in left shoulder: Secondary | ICD-10-CM | POA: Diagnosis not present

## 2020-01-24 DIAGNOSIS — M25612 Stiffness of left shoulder, not elsewhere classified: Secondary | ICD-10-CM | POA: Diagnosis not present

## 2020-01-30 DIAGNOSIS — M25511 Pain in right shoulder: Secondary | ICD-10-CM | POA: Diagnosis not present

## 2020-01-30 DIAGNOSIS — R293 Abnormal posture: Secondary | ICD-10-CM | POA: Diagnosis not present

## 2020-01-30 DIAGNOSIS — M25512 Pain in left shoulder: Secondary | ICD-10-CM | POA: Diagnosis not present

## 2020-01-30 DIAGNOSIS — M25612 Stiffness of left shoulder, not elsewhere classified: Secondary | ICD-10-CM | POA: Diagnosis not present

## 2020-01-30 DIAGNOSIS — Z23 Encounter for immunization: Secondary | ICD-10-CM | POA: Diagnosis not present

## 2020-02-03 ENCOUNTER — Other Ambulatory Visit: Payer: Self-pay | Admitting: Cardiovascular Disease

## 2020-02-28 ENCOUNTER — Other Ambulatory Visit: Payer: Medicare HMO

## 2020-02-28 DIAGNOSIS — Z20822 Contact with and (suspected) exposure to covid-19: Secondary | ICD-10-CM

## 2020-02-29 LAB — NOVEL CORONAVIRUS, NAA: SARS-CoV-2, NAA: NOT DETECTED

## 2020-02-29 LAB — SARS-COV-2, NAA 2 DAY TAT

## 2020-04-01 ENCOUNTER — Other Ambulatory Visit: Payer: Medicare HMO

## 2020-04-01 DIAGNOSIS — Z20822 Contact with and (suspected) exposure to covid-19: Secondary | ICD-10-CM | POA: Diagnosis not present

## 2020-04-02 LAB — NOVEL CORONAVIRUS, NAA: SARS-CoV-2, NAA: NOT DETECTED

## 2020-04-02 LAB — SARS-COV-2, NAA 2 DAY TAT

## 2020-04-29 DIAGNOSIS — H259 Unspecified age-related cataract: Secondary | ICD-10-CM | POA: Diagnosis not present

## 2020-04-29 DIAGNOSIS — Z135 Encounter for screening for eye and ear disorders: Secondary | ICD-10-CM | POA: Diagnosis not present

## 2020-04-30 ENCOUNTER — Other Ambulatory Visit: Payer: Medicare HMO

## 2020-04-30 DIAGNOSIS — Z20822 Contact with and (suspected) exposure to covid-19: Secondary | ICD-10-CM

## 2020-05-01 LAB — NOVEL CORONAVIRUS, NAA: SARS-CoV-2, NAA: NOT DETECTED

## 2020-05-01 LAB — SARS-COV-2, NAA 2 DAY TAT

## 2020-05-20 DIAGNOSIS — Z0000 Encounter for general adult medical examination without abnormal findings: Secondary | ICD-10-CM | POA: Diagnosis not present

## 2020-05-20 DIAGNOSIS — Z1389 Encounter for screening for other disorder: Secondary | ICD-10-CM | POA: Diagnosis not present

## 2020-05-30 DIAGNOSIS — Z86010 Personal history of colonic polyps: Secondary | ICD-10-CM | POA: Diagnosis not present

## 2020-05-30 DIAGNOSIS — E782 Mixed hyperlipidemia: Secondary | ICD-10-CM | POA: Diagnosis not present

## 2020-05-30 DIAGNOSIS — R030 Elevated blood-pressure reading, without diagnosis of hypertension: Secondary | ICD-10-CM | POA: Diagnosis not present

## 2020-05-30 DIAGNOSIS — R7303 Prediabetes: Secondary | ICD-10-CM | POA: Diagnosis not present

## 2020-05-30 DIAGNOSIS — I10 Essential (primary) hypertension: Secondary | ICD-10-CM | POA: Diagnosis not present

## 2020-05-30 DIAGNOSIS — R519 Headache, unspecified: Secondary | ICD-10-CM | POA: Diagnosis not present

## 2020-05-30 DIAGNOSIS — Z125 Encounter for screening for malignant neoplasm of prostate: Secondary | ICD-10-CM | POA: Diagnosis not present

## 2020-06-11 DIAGNOSIS — H2511 Age-related nuclear cataract, right eye: Secondary | ICD-10-CM | POA: Diagnosis not present

## 2020-06-11 DIAGNOSIS — H2513 Age-related nuclear cataract, bilateral: Secondary | ICD-10-CM | POA: Diagnosis not present

## 2020-06-11 DIAGNOSIS — H25013 Cortical age-related cataract, bilateral: Secondary | ICD-10-CM | POA: Diagnosis not present

## 2020-06-11 DIAGNOSIS — H18413 Arcus senilis, bilateral: Secondary | ICD-10-CM | POA: Diagnosis not present

## 2020-06-11 DIAGNOSIS — H25043 Posterior subcapsular polar age-related cataract, bilateral: Secondary | ICD-10-CM | POA: Diagnosis not present

## 2020-06-17 ENCOUNTER — Other Ambulatory Visit: Payer: Self-pay

## 2020-06-17 MED ORDER — ROSUVASTATIN CALCIUM 10 MG PO TABS: 10.0000 mg | ORAL_TABLET | Freq: Every day | ORAL | 2 refills | Status: DC

## 2020-07-12 DIAGNOSIS — H2512 Age-related nuclear cataract, left eye: Secondary | ICD-10-CM | POA: Diagnosis not present

## 2020-07-12 DIAGNOSIS — H2511 Age-related nuclear cataract, right eye: Secondary | ICD-10-CM | POA: Diagnosis not present

## 2020-07-17 DIAGNOSIS — L821 Other seborrheic keratosis: Secondary | ICD-10-CM | POA: Diagnosis not present

## 2020-07-17 DIAGNOSIS — L820 Inflamed seborrheic keratosis: Secondary | ICD-10-CM | POA: Diagnosis not present

## 2020-08-09 DIAGNOSIS — H2512 Age-related nuclear cataract, left eye: Secondary | ICD-10-CM | POA: Diagnosis not present

## 2020-09-17 DIAGNOSIS — E782 Mixed hyperlipidemia: Secondary | ICD-10-CM | POA: Diagnosis not present

## 2020-09-17 DIAGNOSIS — I10 Essential (primary) hypertension: Secondary | ICD-10-CM | POA: Diagnosis not present

## 2020-09-19 ENCOUNTER — Other Ambulatory Visit (HOSPITAL_BASED_OUTPATIENT_CLINIC_OR_DEPARTMENT_OTHER): Payer: Self-pay

## 2020-09-19 ENCOUNTER — Ambulatory Visit: Payer: Medicare HMO | Attending: Internal Medicine

## 2020-09-19 ENCOUNTER — Other Ambulatory Visit: Payer: Self-pay

## 2020-09-19 DIAGNOSIS — Z23 Encounter for immunization: Secondary | ICD-10-CM

## 2020-09-19 MED ORDER — COVID-19 MRNA VACC (MODERNA) 100 MCG/0.5ML IM SUSP
INTRAMUSCULAR | 0 refills | Status: AC
  Filled 2020-09-19: qty 0.25, 1d supply, fill #0

## 2020-09-19 NOTE — Progress Notes (Signed)
   Covid-19 Vaccination Clinic  Name:  Tim James    MRN: [id number] DOB: Nov 06, 1945  09/19/2020  Mr. Tim James was observed post Covid-19 immunization for 15 minutes without incident. He was provided with Vaccine Information Sheet and instruction to access the V-Safe system.   Mr. Tim James was instructed to call 911 with any severe reactions post vaccine: [name] Difficulty breathing  . Swelling of face and throat  . A fast heartbeat  . A bad rash all over body  . Dizziness and weakness   Immunizations Administered    Name Date Dose VIS Date Route   Moderna Covid-19 Booster Vaccine 09/19/2020  3:35 PM 0.25 mL 02/28/2020 Intramuscular   Manufacturer: Moderna   Lot: [id number]   [place]: [id number]

## 2020-11-09 IMAGING — CT CT HEART SCORING
2 series · 16 of 20 positions shown, 18 images · non-contrast
Comparison: None.
COMPARISON: None.

Addendum:
EXAM:
OVER-READ INTERPRETATION  CT CHEST

The following report is an over-read performed by radiologist Dr.
over-read does not include interpretation of cardiac or coronary
anatomy or pathology. The coronary calcium score interpretation by
the cardiologist is attached.
CLINICAL DATA: Risk stratification
Coronary Calcium Score
TECHNIQUE: The patient was scanned on a Siemens Force scanner. Axial
non-contrast 3 mm slices were carried out through the heart. The
data set was analyzed on a dedicated work station and scored using
the Agatson method.

[Series 2: casc 3.0 i36f 2 bestdiast 73 % · axial · 0.43mm/px · z∈[-232,-118]mm · 8 of 50 slices shown, 10 images]
[im 6/50  vessel]
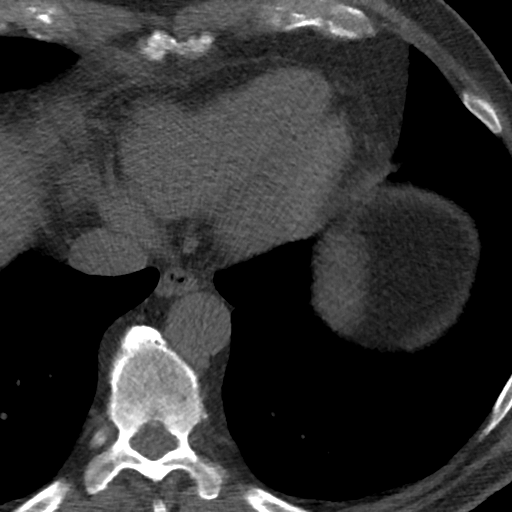
[im 6/50  lung]
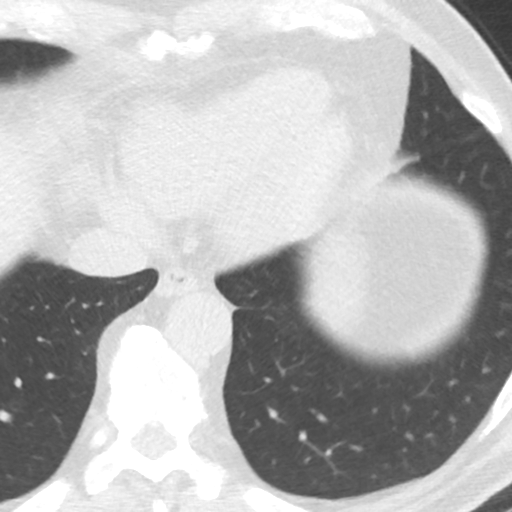
[im 11/50  vessel]
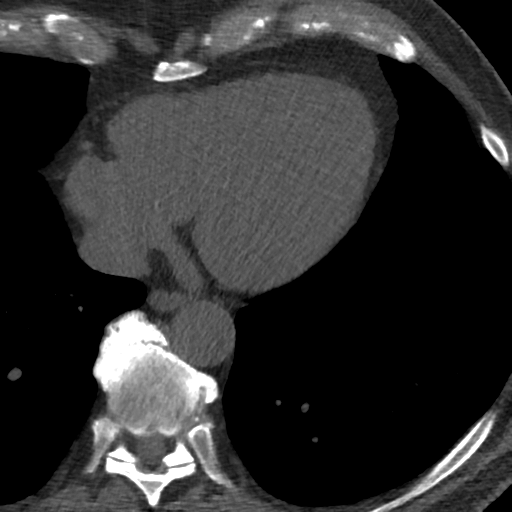
[im 17/50  vessel]
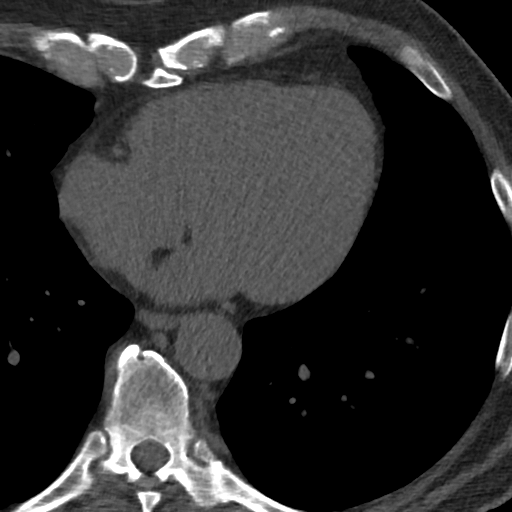
[im 22/50  vessel]
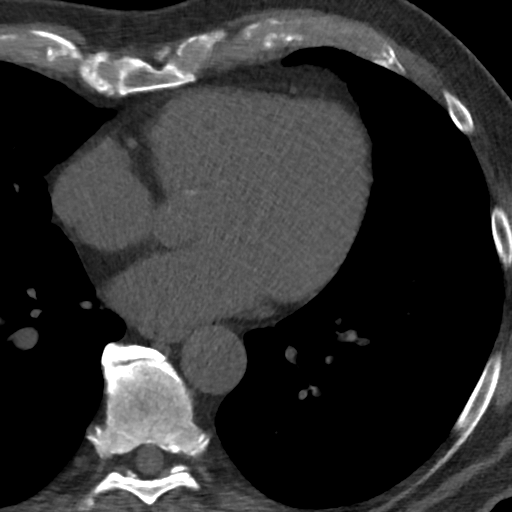
[im 28/50  vessel]
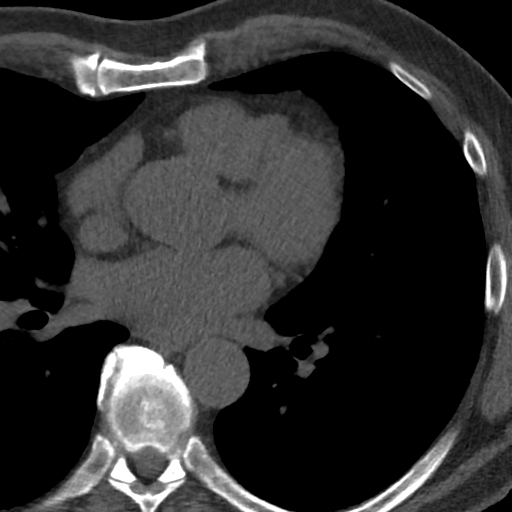
[im 28/50  lung]
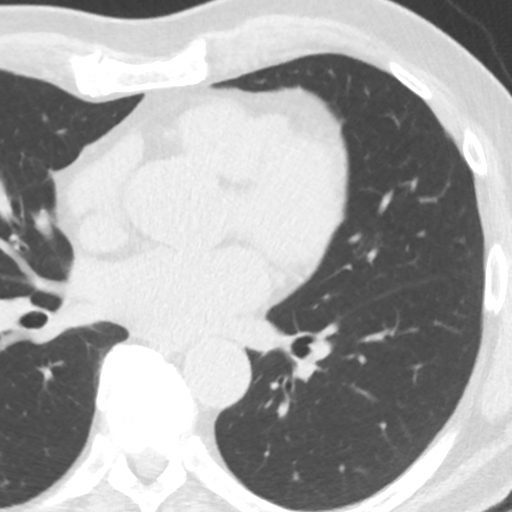
[im 33/50  vessel]
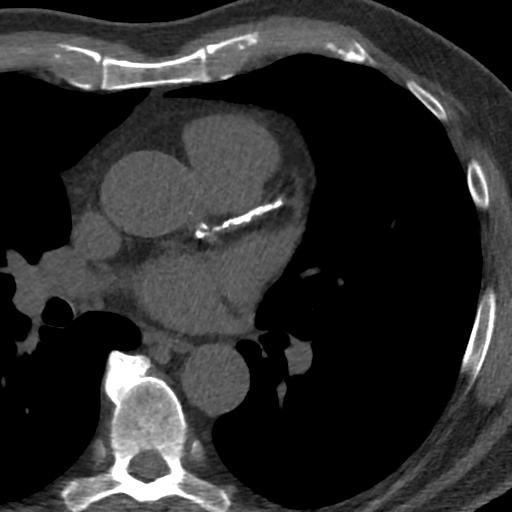
[im 39/50  vessel]
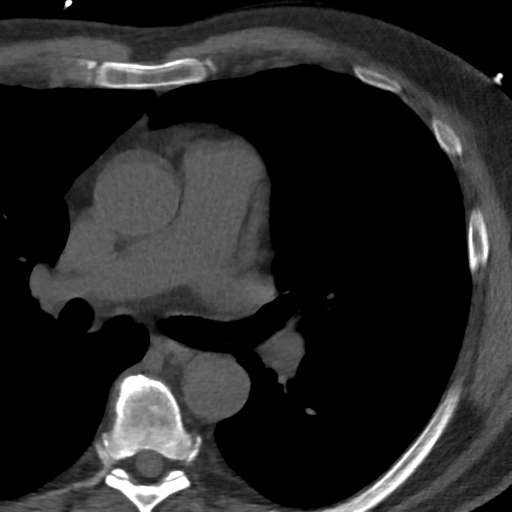
[im 44/50  vessel]
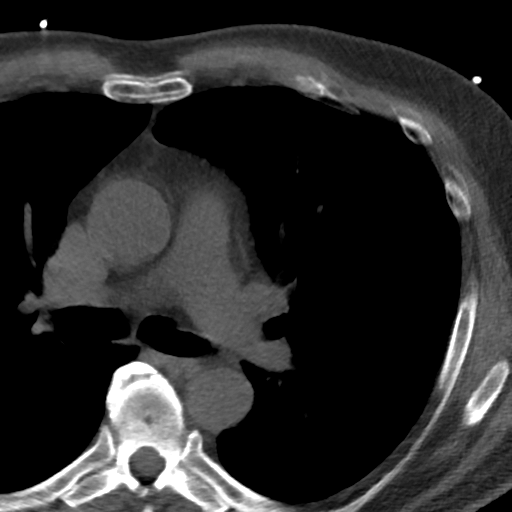

[Series 4: lung st 73 % · axial · 0.80mm/px · z∈[-232,-118]mm · 8 of 50 slices shown]
[im 6/50  lung]
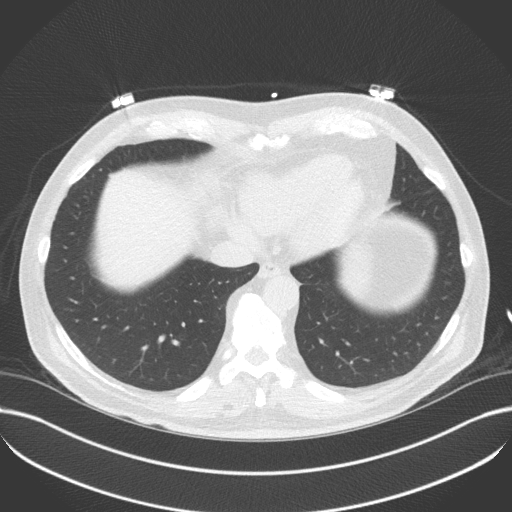
[im 11/50  lung]
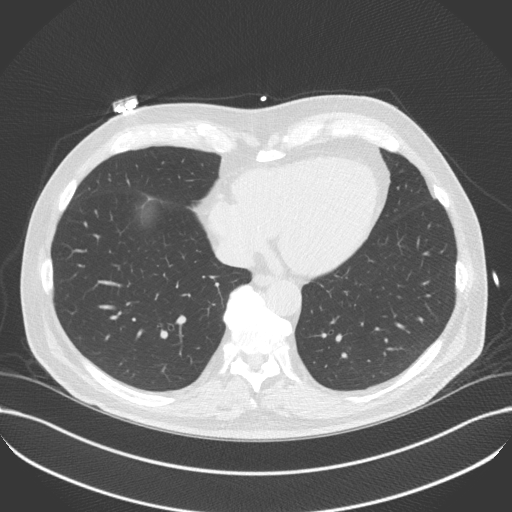
[im 17/50  lung]
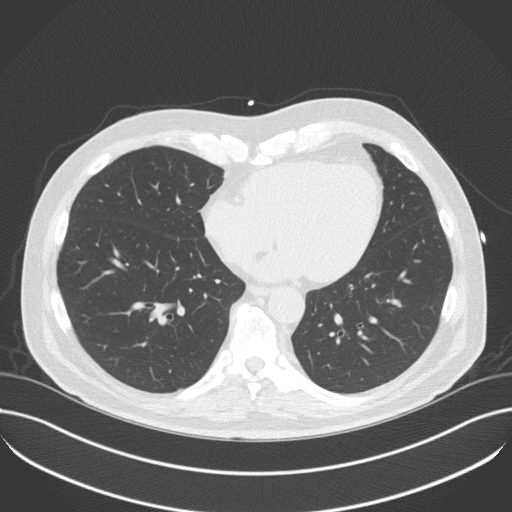
[im 22/50  lung]
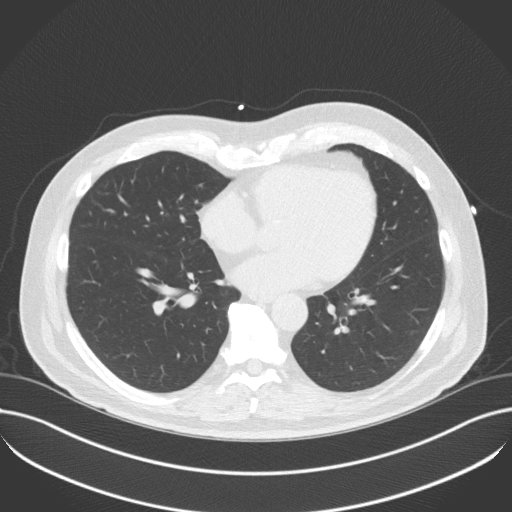
[im 28/50  lung]
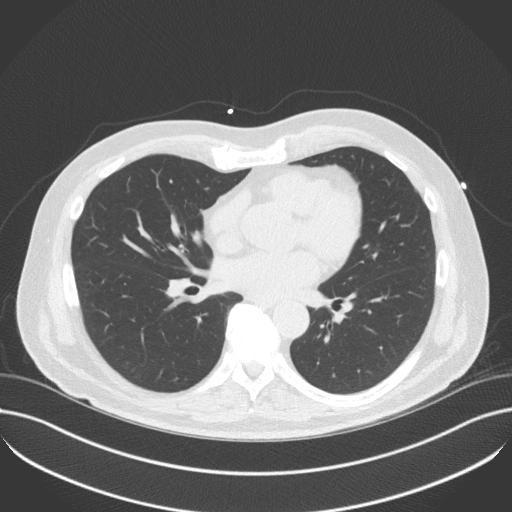
[im 33/50  lung]
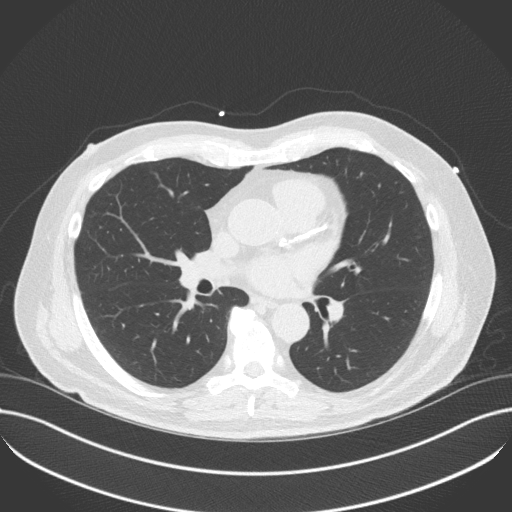
[im 39/50  lung]
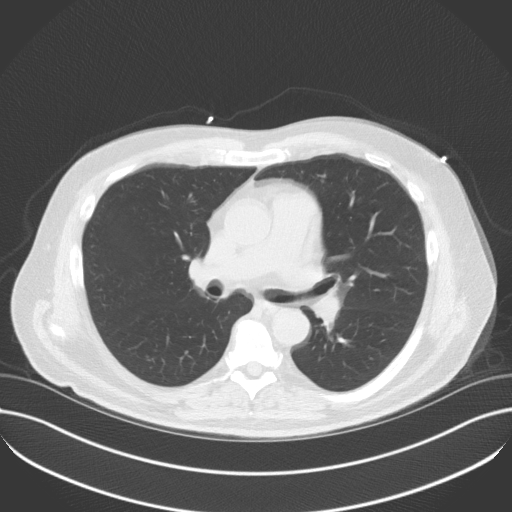
[im 44/50  lung]
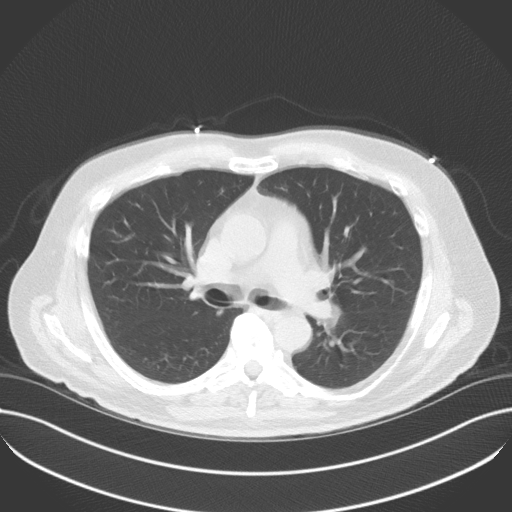

[16 of 20 positions shown; findings below may reference images not displayed]

FINDINGS: Limited view of the lung parenchyma demonstrates no suspicious
nodularity. Airways are normal.

Limited view of the mediastinum demonstrates no adenopathy.
Esophagus normal.

Limited view of the upper abdomen unremarkable.

Limited view of the skeleton and chest wall is unremarkable.
IMPRESSION: No significant extracardiac findings.
FINDINGS: Non-cardiac: See separate report from [REDACTED].

Ascending Aorta: Normal Caliber. No calcifications. Calcifications
noted in Aortic annulus.

Pericardium: Normal

Coronary arteries: Normal coronary origins.
IMPRESSION: Coronary calcium score of 634. This was 78th percentile for age and
sex matched control.

Herlin Arun

*** End of Addendum ***
EXAM:
OVER-READ INTERPRETATION  CT CHEST

The following report is an over-read performed by radiologist Dr.
over-read does not include interpretation of cardiac or coronary
anatomy or pathology. The coronary calcium score interpretation by
the cardiologist is attached.
FINDINGS: Limited view of the lung parenchyma demonstrates no suspicious
nodularity. Airways are normal.

Limited view of the mediastinum demonstrates no adenopathy.
Esophagus normal.

Limited view of the upper abdomen unremarkable.

Limited view of the skeleton and chest wall is unremarkable.
IMPRESSION: No significant extracardiac findings.

## 2020-11-14 DIAGNOSIS — Z87440 Personal history of urinary (tract) infections: Secondary | ICD-10-CM | POA: Diagnosis not present

## 2020-11-14 DIAGNOSIS — R7303 Prediabetes: Secondary | ICD-10-CM | POA: Diagnosis not present

## 2020-11-14 DIAGNOSIS — Z87898 Personal history of other specified conditions: Secondary | ICD-10-CM | POA: Diagnosis not present

## 2020-11-14 DIAGNOSIS — E782 Mixed hyperlipidemia: Secondary | ICD-10-CM | POA: Diagnosis not present

## 2020-11-14 DIAGNOSIS — I10 Essential (primary) hypertension: Secondary | ICD-10-CM | POA: Diagnosis not present

## 2020-11-18 ENCOUNTER — Other Ambulatory Visit: Payer: Self-pay | Admitting: Family Medicine

## 2020-11-18 DIAGNOSIS — Z87898 Personal history of other specified conditions: Secondary | ICD-10-CM

## 2020-12-04 ENCOUNTER — Ambulatory Visit
Admission: RE | Admit: 2020-12-04 | Discharge: 2020-12-04 | Disposition: A | Discharge status: Home / Self Care | Payer: Medicare HMO | Source: Ambulatory Visit | Attending: Family Medicine | Admitting: Family Medicine

## 2020-12-04 DIAGNOSIS — Z87898 Personal history of other specified conditions: Secondary | ICD-10-CM

## 2020-12-04 DIAGNOSIS — N281 Cyst of kidney, acquired: Secondary | ICD-10-CM | POA: Diagnosis not present

## 2021-01-06 DIAGNOSIS — G4700 Insomnia, unspecified: Secondary | ICD-10-CM | POA: Diagnosis not present

## 2021-01-06 DIAGNOSIS — I10 Essential (primary) hypertension: Secondary | ICD-10-CM | POA: Diagnosis not present

## 2021-01-06 DIAGNOSIS — Z6828 Body mass index (BMI) 28.0-28.9, adult: Secondary | ICD-10-CM | POA: Diagnosis not present

## 2021-01-23 DIAGNOSIS — N281 Cyst of kidney, acquired: Secondary | ICD-10-CM | POA: Diagnosis not present

## 2021-02-06 DIAGNOSIS — Z008 Encounter for other general examination: Secondary | ICD-10-CM | POA: Diagnosis not present

## 2021-02-06 DIAGNOSIS — Z809 Family history of malignant neoplasm, unspecified: Secondary | ICD-10-CM | POA: Diagnosis not present

## 2021-02-06 DIAGNOSIS — I10 Essential (primary) hypertension: Secondary | ICD-10-CM | POA: Diagnosis not present

## 2021-02-06 DIAGNOSIS — Z8051 Family history of malignant neoplasm of kidney: Secondary | ICD-10-CM | POA: Diagnosis not present

## 2021-02-06 DIAGNOSIS — Z8249 Family history of ischemic heart disease and other diseases of the circulatory system: Secondary | ICD-10-CM | POA: Diagnosis not present

## 2021-02-06 DIAGNOSIS — G8929 Other chronic pain: Secondary | ICD-10-CM | POA: Diagnosis not present

## 2021-02-06 DIAGNOSIS — E785 Hyperlipidemia, unspecified: Secondary | ICD-10-CM | POA: Diagnosis not present

## 2021-02-19 DIAGNOSIS — Z6827 Body mass index (BMI) 27.0-27.9, adult: Secondary | ICD-10-CM | POA: Diagnosis not present

## 2021-02-19 DIAGNOSIS — G4700 Insomnia, unspecified: Secondary | ICD-10-CM | POA: Diagnosis not present

## 2021-02-20 ENCOUNTER — Other Ambulatory Visit: Payer: Self-pay | Admitting: Cardiovascular Disease

## 2021-02-24 ENCOUNTER — Telehealth: Payer: Self-pay | Admitting: Cardiovascular Disease

## 2021-02-24 DIAGNOSIS — Z79899 Other long term (current) drug therapy: Secondary | ICD-10-CM

## 2021-02-24 DIAGNOSIS — I10 Essential (primary) hypertension: Secondary | ICD-10-CM

## 2021-02-24 DIAGNOSIS — E785 Hyperlipidemia, unspecified: Secondary | ICD-10-CM

## 2021-02-24 NOTE — Telephone Encounter (Signed)
Spoke with pt and advised order has been placed for fasting labs.  Appointment scheduled for 02/28/2021.  Pt advised he may come at 8am for this lab work.  Pt verbalizes understanding and agrees with current plan.

## 2021-02-24 NOTE — Telephone Encounter (Signed)
Tim James is calling requesting an order for labs be placed so he can come in and have them performed prior to his scheduled appointment. He is requesting a callback once they are placed to schedule coming in.

## 2021-02-24 NOTE — Telephone Encounter (Signed)
Fasting CMET, Lipids. Order under Dr. Acie Fredrickson. Richardson Dopp, PA-C    02/24/2021 11:09 AM

## 2021-02-24 NOTE — Telephone Encounter (Signed)
Spoke with pt who is requesting labs be ordered prior to his appointment with Dr Acie Fredrickson 03/03/2021.  Pt advised Dr Acie Fredrickson is out of the office this week but will reach out to Nahser's PA to see if he would be willing to order as pt would like to have results to discuss during his 10/24 appointment.  Pt verbalizes understanding and agrees with current plan.

## 2021-02-28 ENCOUNTER — Other Ambulatory Visit: Payer: Self-pay

## 2021-02-28 ENCOUNTER — Other Ambulatory Visit: Payer: Medicare HMO | Admitting: *Deleted

## 2021-02-28 DIAGNOSIS — E785 Hyperlipidemia, unspecified: Secondary | ICD-10-CM | POA: Diagnosis not present

## 2021-02-28 DIAGNOSIS — Z79899 Other long term (current) drug therapy: Secondary | ICD-10-CM | POA: Diagnosis not present

## 2021-02-28 DIAGNOSIS — I10 Essential (primary) hypertension: Secondary | ICD-10-CM | POA: Diagnosis not present

## 2021-02-28 LAB — COMPREHENSIVE METABOLIC PANEL
ALT: 17 IU/L (ref 0–44)
AST: 18 IU/L (ref 0–40)
Albumin/Globulin Ratio: 1.8 (ref 1.2–2.2)
Albumin: 4.4 g/dL (ref 3.7–4.7)
Alkaline Phosphatase: 75 IU/L (ref 44–121)
BUN/Creatinine Ratio: 14 (ref 10–24)
BUN: 13 mg/dL (ref 8–27)
Bilirubin Total: 0.7 mg/dL (ref 0.0–1.2)
CO2: 26 mmol/L (ref 20–29)
Calcium: 9.2 mg/dL (ref 8.6–10.2)
Chloride: 102 mmol/L (ref 96–106)
Creatinine, Ser: 0.92 mg/dL (ref 0.76–1.27)
Globulin, Total: 2.4 g/dL (ref 1.5–4.5)
Glucose: 94 mg/dL (ref 70–99)
Potassium: 4.2 mmol/L (ref 3.5–5.2)
Sodium: 142 mmol/L (ref 134–144)
Total Protein: 6.8 g/dL (ref 6.0–8.5)
eGFR: 88 mL/min/{1.73_m2} (ref >59)

## 2021-02-28 LAB — LIPID PANEL
Chol/HDL Ratio: 2.9 ratio (ref 0.0–5.0)
Cholesterol, Total: 147 mg/dL (ref 100–199)
HDL: 51 mg/dL (ref >39)
LDL Chol Calc (NIH): 80 mg/dL (ref 0–99)
Triglycerides: 87 mg/dL (ref 0–149)
VLDL Cholesterol Cal: 16 mg/dL (ref 5–40)

## 2021-03-02 ENCOUNTER — Encounter: Payer: Self-pay | Admitting: Cardiovascular Disease

## 2021-03-02 NOTE — Progress Notes (Signed)
Tim James Date of Birth  March 14, 1946       [place]     [address] [place]   [place], [place]  [address]     [phone]          Problem List: 1.  Hypertension  2.  Chest pain  3.  Fatigue 4. Hyperlipidemia   Tim James is a 75 y.o. gentleman with hx of HTN and hyperlipidemia.  He has done well.  He does not exercise as much as he would like.  He has not had any chest pain or dyspnea.  Nov.  10, 2014:  Tim James is doing well.   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    He needs to walk more.     Nov. 11, 2015:  Tim James is seen back today for follow up of his HTN and hyperlipidemia. He still teaches middle school math.  Wants to work until 76. No Cp or dyspnea.   Walks around at school.  Walks the dogs at night. Has has lost about 20 lbs since Sept.  Feels better.  Feb. 1, 2017:  Doing well.  BP is a bit high this am - some stresses this am .  Avoiding salt. Has not been exercising much , lots of left knee pain   Jan. 12, 2018:  Tim James is doing well.   Has had some chest pressure for the past month. The pain moves around , from 1 side to another, to the back Is not worsened with activity. Has a cough, has had a minor cold. Has not been walking much  Is working at a new job - now work in [place] .   Sep 24, 2017:  Tim James is seen today for follow-up of his hypertension, chest pain, and fatigue. He also has a history of hyperlipidemia.  Recent labs reveal a total cholesterol of 214.  His HDL is 53.9.  The LDL is 139.  Triglyceride level is 103.   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 13, 2021:  Tim James is seen today for follow up of his HTN, HLD .  Staying active ,  Has retired complely now.   Stays busy Has a place near [facility].  Labs from last week  are reviewed and look great.  Has had both covid vaccines.   Wife and daughter had break through covid months after getting vaccines.   Oct. 24, 2022: Tim James is seen today for follow up of his HTN, HLD  Retired from teaching at the beginning of [employer] lots of time at his cabin in [place] .  No CP or dyspnea,   Gets 8000 steps a day  Lipids from January look KO .  LDL iis 106 Coronary artery calcium score from August, 2020 was 634 which places him in the 24 percentile for age and sex matched controls.  Based on his lipid levels from earlier this year we will increase his rosuvastatin to 20 mg a day.  He has had some muscle aches in the past.  I have encouraged him to call us if he has recurrent muscle aches on the higher dose of rosuvastatin.  If that is the case we will refer him to the lipid clinic for  consideration of a PCSK9 inhibitor or inclisaran  Current Outpatient Medications on File Prior to Visit  Medication Sig Dispense Refill   AMINO ACIDS PO Take 1 tablet by mouth daily.     Ascorbic Acid (VITAMIN C) 100 MG tablet Take 100 mg by mouth daily.       B Complex-C (B-COMPLEX WITH VITAMIN C) tablet Take 1 tablet by mouth daily.     carvedilol (COREG) 12.5 MG tablet Take 1 tablet (12.5 mg total) by mouth 2 (two) times daily. Please keep upcoming appt in October 2022 with Dr. Acie Fredrickson before anymore refills. Thank you 180 tablet 0   Cholecalciferol (VITAMIN D-3 PO) Take 1,000 Units by mouth daily.     COVID-19 mRNA vaccine, Moderna, 100 MCG/0.5ML injection Inject into the muscle. 0.25 mL 0   EPINEPHrine 0.3 mg/0.3 mL IJ SOAJ injection Inject 1 Dose into the muscle as needed.     fish oil-omega-3 fatty acids 1000 MG capsule Take 2 g by mouth daily.       Flaxseed, Linseed, 1000 MG CAPS Take 1,000 mg by mouth daily.     glucosamine-chondroitin 500-400 MG tablet Take 2 tablets by mouth daily.      rosuvastatin (CRESTOR) 10 MG tablet Take 1 tablet (10 mg total) by mouth  daily. 90 tablet 2   saw palmetto 160 MG capsule Take 160 mg by mouth 2 (two) times daily.       vitamin E 400 UNIT capsule Take 400 Units by mouth daily.       aspirin 81 MG tablet Take 81 mg by mouth daily.   (Patient not taking: Reported on 03/03/2021)     No current facility-administered medications on file prior to visit.    No Known Allergies  Past Medical History:  Diagnosis Date   Fatigue    Hyperlipidemia    Hypertension     Past Surgical History:  Procedure Laterality Date   APPENDECTOMY     TONSILLECTOMY      Social History   Tobacco Use  Smoking Status Never  Smokeless Tobacco Never    Social History   Substance and Sexual Activity  Alcohol Use No    Family History  Problem Relation Age of Onset   Hypertension Mother    Angina Mother    Hypertension Father    Hypertension Sister    Hypertension Brother    Hypertension Sister     Reviw of Systems:  Noted in current history, otherwise review of systems is negative.    Physical Exam: Blood pressure 132/72, pulse 63, height 5' 11.5" (1.816 m), weight 197 lb (89.4 kg), SpO2 96 %.  GEN:  Well nourished, well developed in no acute distress HEENT: Normal NECK: No JVD; No carotid bruits LYMPHATICS: No lymphadenopathy CARDIAC: RRR   no murmurs, rubs, gallops RESPIRATORY:  Clear to auscultation without rales, wheezing or rhonchi  ABDOMEN: Soft, non-tender, non-distended MUSCULOSKELETAL:  No edema; No deformity  SKIN: Warm and dry NEUROLOGIC:  Alert and oriented x 3   EKG:    Assessment / Plan:   1.  Hypertension  -his blood pressure is very well controlled.  Continue current medications.  2.  Hyperlipidemia:  -His last lipid levels were mildly elevated.  He is on rosuvastatin 10 mg a day.  We will increase the rosuvastatin to 20 mg a day.  He has a coronary calcium score greater than 600.  If he does not tolerate the higher dose of rosuvastatin, we will refer him to  our lipid clinic for  consideration for one of the injectable PCSK9 inhibitors or inclisiran .   Mertie Moores, MD  03/03/2021 9:40 AM    [facility] [place],  [place], [place]  [address] Pager [phone] Phone: [phone]; Fax: [phone]

## 2021-03-03 ENCOUNTER — Encounter: Payer: Self-pay | Admitting: Cardiovascular Disease

## 2021-03-03 ENCOUNTER — Other Ambulatory Visit: Payer: Self-pay

## 2021-03-03 ENCOUNTER — Ambulatory Visit: Payer: Medicare HMO | Admitting: Cardiovascular Disease

## 2021-03-03 VITALS — BP 132/72 | HR 63 | Ht 71.5 in | Wt 197.0 lb

## 2021-03-03 DIAGNOSIS — Z79899 Other long term (current) drug therapy: Secondary | ICD-10-CM

## 2021-03-03 DIAGNOSIS — E7800 Pure hypercholesterolemia, unspecified: Secondary | ICD-10-CM

## 2021-03-03 DIAGNOSIS — I10 Essential (primary) hypertension: Secondary | ICD-10-CM | POA: Diagnosis not present

## 2021-03-03 DIAGNOSIS — E785 Hyperlipidemia, unspecified: Secondary | ICD-10-CM | POA: Diagnosis not present

## 2021-03-03 MED ORDER — ROSUVASTATIN CALCIUM 20 MG PO TABS: 20.0000 mg | ORAL_TABLET | Freq: Every day | ORAL | 3 refills | Status: DC

## 2021-03-03 NOTE — Patient Instructions (Signed)
Medication Instructions:  Your physician has recommended you make the following change in your medication: Increase your Rosuvastatin to 20 mg daily   *If you need a refill on your cardiac medications before your next appointment, please call your pharmacy*   Lab Work: Fasting Lipids, ALT, BMET in 3 months   If you have labs (blood work) drawn today and your tests are completely normal, you will receive your results only by: [place] (if you have MyChart) OR A paper copy in the mail If you have any lab test that is abnormal or we need to change your treatment, we will call you to review the results.   Testing/Procedures: none   Follow-Up: At [facility], you and your health needs are our priority.  As part of our continuing mission to provide you with exceptional heart care, we have created designated Provider Care Teams.  These Care Teams include your primary Cardiologist (physician) and Advanced Practice Providers (APPs -  Physician Assistants and Nurse Practitioners) who all work together to provide you with the care you need, when you need it.  We recommend signing up for the patient portal called "MyChart".  Sign up information is provided on this After Visit Summary.  MyChart is used to connect with patients for Virtual Visits (Telemedicine).  Patients are able to view lab/test results, encounter notes, upcoming appointments, etc.  Non-urgent messages can be sent to your provider as well.   To learn more about what you can do with MyChart, go to [email or web address].    Your next appointment:   1 year(s)  The format for your next appointment:   In Person  Provider:   Mertie Moores, MD   Other Instructions

## 2021-03-18 ENCOUNTER — Other Ambulatory Visit: Payer: Self-pay | Admitting: Cardiovascular Disease

## 2021-05-20 DIAGNOSIS — Z961 Presence of intraocular lens: Secondary | ICD-10-CM | POA: Diagnosis not present

## 2021-05-20 DIAGNOSIS — Z135 Encounter for screening for eye and ear disorders: Secondary | ICD-10-CM | POA: Diagnosis not present

## 2021-05-20 DIAGNOSIS — H52223 Regular astigmatism, bilateral: Secondary | ICD-10-CM | POA: Diagnosis not present

## 2021-05-30 DIAGNOSIS — H52223 Regular astigmatism, bilateral: Secondary | ICD-10-CM | POA: Diagnosis not present

## 2021-05-30 DIAGNOSIS — H524 Presbyopia: Secondary | ICD-10-CM | POA: Diagnosis not present

## 2021-06-02 ENCOUNTER — Other Ambulatory Visit: Payer: Medicare HMO

## 2021-06-02 ENCOUNTER — Other Ambulatory Visit: Payer: Self-pay

## 2021-06-02 DIAGNOSIS — Z79899 Other long term (current) drug therapy: Secondary | ICD-10-CM

## 2021-06-02 DIAGNOSIS — I10 Essential (primary) hypertension: Secondary | ICD-10-CM | POA: Diagnosis not present

## 2021-06-02 DIAGNOSIS — E785 Hyperlipidemia, unspecified: Secondary | ICD-10-CM | POA: Diagnosis not present

## 2021-06-02 DIAGNOSIS — E7800 Pure hypercholesterolemia, unspecified: Secondary | ICD-10-CM

## 2021-06-02 LAB — BASIC METABOLIC PANEL
BUN/Creatinine Ratio: 13 (ref 10–24)
BUN: 12 mg/dL (ref 8–27)
CO2: 27 mmol/L (ref 20–29)
Calcium: 9.5 mg/dL (ref 8.6–10.2)
Chloride: 101 mmol/L (ref 96–106)
Creatinine, Ser: 0.96 mg/dL (ref 0.76–1.27)
Glucose: 110 mg/dL — ABNORMAL HIGH (ref 70–99)
Potassium: 4.2 mmol/L (ref 3.5–5.2)
Sodium: 139 mmol/L (ref 134–144)
eGFR: 83 mL/min/{1.73_m2} (ref >59)

## 2021-06-02 LAB — ALT: ALT: 17 IU/L (ref 0–44)

## 2021-06-02 LAB — LIPID PANEL
Chol/HDL Ratio: 3.1 ratio (ref 0.0–5.0)
Cholesterol, Total: 151 mg/dL (ref 100–199)
HDL: 48 mg/dL (ref >39)
LDL Chol Calc (NIH): 85 mg/dL (ref 0–99)
Triglycerides: 97 mg/dL (ref 0–149)
VLDL Cholesterol Cal: 18 mg/dL (ref 5–40)

## 2021-06-25 ENCOUNTER — Other Ambulatory Visit: Payer: Self-pay | Admitting: Cardiovascular Disease

## 2021-08-25 DIAGNOSIS — L821 Other seborrheic keratosis: Secondary | ICD-10-CM | POA: Diagnosis not present

## 2021-08-25 DIAGNOSIS — L814 Other melanin hyperpigmentation: Secondary | ICD-10-CM | POA: Diagnosis not present

## 2021-08-25 DIAGNOSIS — D485 Neoplasm of uncertain behavior of skin: Secondary | ICD-10-CM | POA: Diagnosis not present

## 2021-08-25 DIAGNOSIS — L820 Inflamed seborrheic keratosis: Secondary | ICD-10-CM | POA: Diagnosis not present

## 2022-02-18 DIAGNOSIS — Z87892 Personal history of anaphylaxis: Secondary | ICD-10-CM | POA: Diagnosis not present

## 2022-02-18 DIAGNOSIS — Z9181 History of falling: Secondary | ICD-10-CM | POA: Diagnosis not present

## 2022-02-18 DIAGNOSIS — E785 Hyperlipidemia, unspecified: Secondary | ICD-10-CM | POA: Diagnosis not present

## 2022-02-18 DIAGNOSIS — I10 Essential (primary) hypertension: Secondary | ICD-10-CM | POA: Diagnosis not present

## 2022-02-18 DIAGNOSIS — Z91038 Other insect allergy status: Secondary | ICD-10-CM | POA: Diagnosis not present

## 2022-02-18 DIAGNOSIS — K08409 Partial loss of teeth, unspecified cause, unspecified class: Secondary | ICD-10-CM | POA: Diagnosis not present

## 2022-02-18 DIAGNOSIS — Z91011 Allergy to milk products: Secondary | ICD-10-CM | POA: Diagnosis not present

## 2022-02-18 DIAGNOSIS — Z791 Long term (current) use of non-steroidal anti-inflammatories (NSAID): Secondary | ICD-10-CM | POA: Diagnosis not present

## 2022-02-18 DIAGNOSIS — Z803 Family history of malignant neoplasm of breast: Secondary | ICD-10-CM | POA: Diagnosis not present

## 2022-02-18 DIAGNOSIS — Z8249 Family history of ischemic heart disease and other diseases of the circulatory system: Secondary | ICD-10-CM | POA: Diagnosis not present

## 2022-02-20 ENCOUNTER — Other Ambulatory Visit (HOSPITAL_BASED_OUTPATIENT_CLINIC_OR_DEPARTMENT_OTHER): Payer: Self-pay

## 2022-02-20 MED ORDER — COVID-19 MRNA 2023-2024 VACCINE (COMIRNATY) 0.3 ML INJECTION
INTRAMUSCULAR | 0 refills | Status: AC
  Filled 2022-02-20: qty 0.3, 1d supply, fill #0

## 2022-02-26 DIAGNOSIS — Z23 Encounter for immunization: Secondary | ICD-10-CM | POA: Diagnosis not present

## 2022-03-14 ENCOUNTER — Other Ambulatory Visit: Payer: Self-pay | Admitting: Cardiovascular Disease

## 2022-03-30 ENCOUNTER — Encounter: Payer: Self-pay | Admitting: Cardiovascular Disease

## 2022-03-30 NOTE — Progress Notes (Unsigned)
Tim James Date of Birth  October 17, 1945       [place]     [address] [place]   [place], [place]  [address]     [phone]          Problem List: 1.  Hypertension  2.  Chest pain  3.  Fatigue 4. Hyperlipidemia   Tim James is a 76 y.o. gentleman with hx of HTN and hyperlipidemia.  He has done well.  He does not exercise as much as he would like.  He has not had any chest pain or dyspnea.  Nov.  10, 2014:  Tim James is doing well.   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    He needs to walk more.     Nov. 11, 2015:  Tim James is seen back today for follow up of his HTN and hyperlipidemia. He still teaches middle school math.  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 No Cp or dyspnea.   Walks around at school.  Walks the dogs at night. Has has lost about 20 lbs since Sept.  Feels better.  Feb. 1, 2017:  Doing well.  BP is a bit high this am - some stresses this am .  Avoiding salt. Has not been exercising much , lots of left knee pain   Jan. 12, 2018:  Tim James is doing well.   Has had some chest pressure for the past month. The pain moves around , from 1 side to another, to the back Is not worsened with activity. Has a cough, has had a minor cold. Has not been walking much  Is working at a new job - now work in [place] .   Sep 24, 2017:  Tim James is seen today for follow-up of his hypertension, chest pain, and fatigue. He also has a history of hyperlipidemia.  Recent labs reveal a total cholesterol of 214.  His HDL is 53.9.  The LDL is 139.  Triglyceride level is 103.   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 13, 2021:  Tim James is seen today for follow up of his HTN, HLD .  Staying active ,  Has retired complely now.   Stays busy Has a place near [facility].  Labs from last week  are reviewed and look great.  Has had both covid vaccines.   Wife and daughter had break through covid months after getting vaccines.   Oct. 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  No CP or dyspnea,   Gets 8000 steps a day  Lipids from January look KO .  LDL iis 106 Coronary artery calcium score from August, 2020 was 634 which places him in the 65 percentile for age and sex matched controls.  Based on his lipid levels from earlier this year we will increase his rosuvastatin to 20 mg a day.  He has had some muscle aches in the past.  I have encouraged him to call us if he has recurrent muscle aches on the higher dose of rosuvastatin.  If that is the case we will refer him to the lipid clinic for  consideration of a PCSK9 inhibitor or inclisaran  Nov. 21, 2023 Tim James is seen for follow up of his HTN, HLD  Elevated coronary calcium score    Current Outpatient Medications on File Prior to Visit  Medication Sig Dispense Refill   AMINO ACIDS PO Take 1 tablet by mouth daily.     Ascorbic Acid (VITAMIN C) 100 MG tablet Take 100 mg by mouth daily.       aspirin 81 MG tablet Take 81 mg by mouth daily.   (Patient not taking: Reported on 03/03/2021)     B Complex-C (B-COMPLEX WITH VITAMIN C) tablet Take 1 tablet by mouth daily.     carvedilol (COREG) 12.5 MG tablet Take 1 tablet (12.5 mg total) by mouth 2 (two) times daily. 180 tablet 2   Cholecalciferol (VITAMIN D-3 PO) Take 1,000 Units by mouth daily.     COVID-19 mRNA vaccine 2023-2024 (COMIRNATY) SUSP injection Inject into the muscle. 0.3 mL 0   COVID-19 mRNA vaccine, Moderna, 100 MCG/0.5ML injection Inject into the muscle. 0.25 mL 0   EPINEPHrine 0.3 mg/0.3 mL IJ SOAJ injection Inject 1 Dose into the muscle as needed.     fish oil-omega-3 fatty acids 1000 MG capsule Take 2 g by mouth daily.       Flaxseed, Linseed,  1000 MG CAPS Take 1,000 mg by mouth daily.     glucosamine-chondroitin 500-400 MG tablet Take 2 tablets by mouth daily.      rosuvastatin (CRESTOR) 20 MG tablet TAKE 1 TABLET BY MOUTH EVERY DAY 90 tablet 3   saw palmetto 160 MG capsule Take 160 mg by mouth 2 (two) times daily.       vitamin E 400 UNIT capsule Take 400 Units by mouth daily.       No current facility-administered medications on file prior to visit.    No Known Allergies  Past Medical History:  Diagnosis Date   Fatigue    Hyperlipidemia    Hypertension     Past Surgical History:  Procedure Laterality Date   APPENDECTOMY     TONSILLECTOMY      Social History   Tobacco Use  Smoking Status Never  Smokeless Tobacco Never    Social History   Substance and Sexual Activity  Alcohol Use No    Family History  Problem Relation Age of Onset   Hypertension Mother    Angina Mother    Hypertension Father    Hypertension Sister    Hypertension Brother    Hypertension Sister     Reviw of Systems:  Noted in current history, otherwise review of systems is negative.    Physical Exam: There were no vitals taken for this visit.  No BP recorded.  {Refresh Note OR Click here to enter BP  :1}***    GEN:  Well nourished, well developed in no acute distress HEENT: Normal NECK: No JVD; No carotid bruits LYMPHATICS: No lymphadenopathy CARDIAC: RRR ***, no murmurs, rubs, gallops RESPIRATORY:  Clear to auscultation without rales, wheezing or rhonchi  ABDOMEN: Soft, non-tender, non-distended MUSCULOSKELETAL:  No edema; No deformity  SKIN: Warm and dry NEUROLOGIC:  Alert and oriented x 3    EKG:    Assessment / Plan:   1.  Hypertension  -   2.  Hyperlipidemia:     Mertie Moores, MD  03/30/2022 9:35 PM    [facility] [place],  [place], [place]  [address] Pager [phone] Phone: [phone];  Fax: [phone]

## 2022-03-31 ENCOUNTER — Ambulatory Visit: Payer: Medicare HMO

## 2022-03-31 ENCOUNTER — Encounter: Payer: Self-pay | Admitting: Cardiovascular Disease

## 2022-03-31 ENCOUNTER — Ambulatory Visit: Payer: Medicare HMO | Attending: Cardiovascular Disease | Admitting: Cardiovascular Disease

## 2022-03-31 ENCOUNTER — Telehealth: Payer: Self-pay | Admitting: Cardiovascular Disease

## 2022-03-31 VITALS — BP 118/65 | HR 62 | Ht 71.0 in | Wt 197.0 lb

## 2022-03-31 DIAGNOSIS — E7800 Pure hypercholesterolemia, unspecified: Secondary | ICD-10-CM | POA: Diagnosis not present

## 2022-03-31 DIAGNOSIS — E785 Hyperlipidemia, unspecified: Secondary | ICD-10-CM

## 2022-03-31 DIAGNOSIS — Z79899 Other long term (current) drug therapy: Secondary | ICD-10-CM

## 2022-03-31 DIAGNOSIS — I10 Essential (primary) hypertension: Secondary | ICD-10-CM | POA: Diagnosis not present

## 2022-03-31 NOTE — Telephone Encounter (Signed)
Patient called back. He would like to make sure he gets a call when the lab orders are put in so he knows when he can come in today.

## 2022-03-31 NOTE — Patient Instructions (Signed)
Medication Instructions:  Your physician recommends that you continue on your current medications as directed. Please refer to the Current Medication list given to you today.  *If you need a refill on your cardiac medications before your next appointment, please call your pharmacy*   Lab Work: NONE If you have labs (blood work) drawn today and your tests are completely normal, you will receive your results only by: MyChart Message (if you have MyChart) OR A paper copy in the mail If you have any lab test that is abnormal or we need to change your treatment, we will call you to review the results.   Testing/Procedures: NONE   Follow-Up: At [facility], you and your health needs are our priority.  As part of our continuing mission to provide you with exceptional heart care, we have created designated Provider Care Teams.  These Care Teams include your primary Cardiologist (physician) and Advanced Practice Providers (APPs -  Physician Assistants and Nurse Practitioners) who all work together to provide you with the care you need, when you need it.  We recommend signing up for the patient portal called "MyChart".  Sign up information is provided on this After Visit Summary.  MyChart is used to connect with patients for Virtual Visits (Telemedicine).  Patients are able to view lab/test results, encounter notes, upcoming appointments, etc.  Non-urgent messages can be sent to your provider as well.   To learn more about what you can do with MyChart, go to [email or web address].    Your next appointment:   1 year(s)  The format for your next appointment:   In Person  Provider:   Philip Nahser, MD       Important Information About Sugar       

## 2022-03-31 NOTE — Telephone Encounter (Signed)
Patient would like to come in this morning to have lab work done.  If orders can be placed for him.  Thank you

## 2022-03-31 NOTE — Telephone Encounter (Signed)
Returned call to patient. Orders placed for lipids, alt, bmet and Lp(a) at this time. Pt coming today for labs.

## 2022-04-01 ENCOUNTER — Telehealth: Payer: Self-pay | Admitting: Cardiovascular Disease

## 2022-04-01 DIAGNOSIS — E785 Hyperlipidemia, unspecified: Secondary | ICD-10-CM

## 2022-04-01 LAB — LIPID PANEL
Chol/HDL Ratio: 3.2 ratio (ref 0.0–5.0)
Cholesterol, Total: 145 mg/dL (ref 100–199)
HDL: 45 mg/dL (ref >39)
LDL Chol Calc (NIH): 86 mg/dL (ref 0–99)
Triglycerides: 71 mg/dL (ref 0–149)
VLDL Cholesterol Cal: 14 mg/dL (ref 5–40)

## 2022-04-01 LAB — BASIC METABOLIC PANEL
BUN/Creatinine Ratio: 11 (ref 10–24)
BUN: 11 mg/dL (ref 8–27)
CO2: 28 mmol/L (ref 20–29)
Calcium: 9.6 mg/dL (ref 8.6–10.2)
Chloride: 101 mmol/L (ref 96–106)
Creatinine, Ser: 1.02 mg/dL (ref 0.76–1.27)
Glucose: 98 mg/dL (ref 70–99)
Potassium: 4.5 mmol/L (ref 3.5–5.2)
Sodium: 140 mmol/L (ref 134–144)
eGFR: 77 mL/min/{1.73_m2} (ref >59)

## 2022-04-01 LAB — LIPOPROTEIN A (LPA): Lipoprotein (a): 121.4 nmol/L — ABNORMAL HIGH (ref <75.0)

## 2022-04-01 LAB — ALT: ALT: 25 IU/L (ref 0–44)

## 2022-04-01 NOTE — Telephone Encounter (Signed)
Pt calling for lab results, he reviewed in mychart and would like to f/u with referral for lipid clinic

## 2022-04-01 NOTE — Telephone Encounter (Signed)
Nahser, Tim Cheng, MD  Donnalee Curry K Cc: Marcelle Overlie D, RPH-CPP Lp(a) is elevated at 121.4 LDL is still elevated at 86 Coronary calcium score is 634. Please refer to lipid clinic for consideration of PCSK9 inhibitor   Order placed for lipid clinic at this time and patient made aware that he will be called to schedule appt.

## 2022-04-10 ENCOUNTER — Other Ambulatory Visit: Payer: Self-pay | Admitting: Cardiovascular Disease

## 2022-05-16 NOTE — Progress Notes (Unsigned)
Patient ID: Tim James                 DOB: 05-17-1945                    MRN: [id number]      HPI: Tim James is a 77 y.o. male patient referred to lipid clinic by Dr.Nahser. PMH is significant for hypertension, HDL, hx chest pressure. Patient saw Dr Acie Fredrickson on 03/31/2022. Annual lab Lp(a) is elevated at 121.4,LDL is still elevated at 86,Coronary calcium score is 634, patient referred to lipid clinic for consideration of PCSK9 inhibitor     Patient reports he eats mostly plant based diet as his wife is vegetarian. He only eats non-red meat dishes when he eats out once a week. He could improve on exercise. During warm months he easily hit 10,000 steps from being active and walking. But during cold months de does not walk that much so he reaches almost 5,000 steps. He has elliptical which he could start using or he could join his wife for yoga every day.  Patient was taking rosuvastatin 10 mg and it has been increased to 20 mg. He did not noticed any difference. He takes garlic, NT-I14 and omega 3 supplement for heart health.   Diet:  One a week eat out, but other than that he is mostly on plant based diet.  Eat lots of salads with home made dressing   Exercise: daily walk- 6000 steps  Family History: dad passed away at age 8 mom passed away at age of 56 from Alzheimer's, mother side  grand father - anyrysm  Mother: angina  Social History:  Alcohol: 2 beers a week  Smoking: never Recreational drug use: none   Labs: Lipid Panel     Component Value Date/Time   CHOL 145 03/31/2022 1030   TRIG 71 03/31/2022 1030   HDL 45 03/31/2022 1030   CHOLHDL 3.2 03/31/2022 1030   CHOLHDL 2.9 06/12/2015 0905   VLDL 13 06/12/2015 0905   LDLCALC 86 03/31/2022 1030   LABVLDL 14 03/31/2022 1030    Past Medical History:  Diagnosis Date   Fatigue    Hyperlipidemia    Hypertension     Current Outpatient Medications on File Prior to Visit  Medication Sig Dispense Refill   AMINO  ACIDS PO Take 1 tablet by mouth daily.     Ascorbic Acid (VITAMIN C) 100 MG tablet Take 100 mg by mouth daily.       aspirin 81 MG tablet Take 81 mg by mouth daily.     B Complex-C (B-COMPLEX WITH VITAMIN C) tablet Take 1 tablet by mouth daily.     carvedilol (COREG) 12.5 MG tablet TAKE 1 TABLET BY MOUTH 2 TIMES DAILY. 180 tablet 3   Cholecalciferol (VITAMIN D-3 PO) Take 1,000 Units by mouth daily.     COVID-19 mRNA vaccine 2023-2024 (COMIRNATY) SUSP injection Inject into the muscle. 0.3 mL 0   COVID-19 mRNA vaccine, Moderna, 100 MCG/0.5ML injection Inject into the muscle. 0.25 mL 0   EPINEPHrine 0.3 mg/0.3 mL IJ SOAJ injection Inject 1 Dose into the muscle as needed.     fish oil-omega-3 fatty acids 1000 MG capsule Take 2 g by mouth daily.       Flaxseed, Linseed, 1000 MG CAPS Take 1,000 mg by mouth daily.     glucosamine-chondroitin 500-400 MG tablet Take 2 tablets by mouth daily.      rosuvastatin (CRESTOR) 20 MG tablet  TAKE 1 TABLET BY MOUTH EVERY DAY 90 tablet 3   saw palmetto 160 MG capsule Take 160 mg by mouth 2 (two) times daily.       vitamin E 400 UNIT capsule Take 400 Units by mouth daily.       No current facility-administered medications on file prior to visit.    No Known Allergies  Problem  Hyperlipidemia   Current Medications: rosuvastatin 20 mg  Past medication trial : atorvastatin 20 mg, simvastatin 40 mg don't know reasons for discontinuation  Risk Factors: Lp(a) is elevated at 121.4,LDL is still elevated at 86 on statin,coronary calcium score is 634, CAD hx of angina  LDL goal: <70 mg/dl    Hyperlipidemia Assessment:  LDL goal: < 70 mg/dl last LDLc 86 mg/dl Lp(a)121 CAC score 634 (03/31/2022) Tolerates high intensity statins well without any side effects  Given high CAC score (CAD, elevated Lp(a) and hx of angina addition of PCSK9i would be reasonable next option  Discussed PCSK-9 inhibitors' cost, dosing efficacy, side effects and administration steps   Plan: Continue taking current medications- Crestor 20 mg  PA approved for Repatha through 05/11/2023 Follow up lab -FLP, Lp(a) in early April 2024    Thank you,  Cammy Copa, Pharm.D [facility] [address], [place], [place] [address]  Phone: [phone]; Fax: [phone]

## 2022-05-18 ENCOUNTER — Telehealth: Payer: Self-pay | Admitting: Pharmacist

## 2022-05-18 ENCOUNTER — Ambulatory Visit: Payer: Medicare HMO | Attending: Cardiology | Admitting: Student

## 2022-05-18 DIAGNOSIS — E7849 Other hyperlipidemia: Secondary | ICD-10-CM | POA: Diagnosis not present

## 2022-05-18 DIAGNOSIS — E7800 Pure hypercholesterolemia, unspecified: Secondary | ICD-10-CM

## 2022-05-18 MED ORDER — REPATHA SURECLICK 140 MG/ML ~~LOC~~ SOAJ: 140.0000 mg | SUBCUTANEOUS | 3 refills | Status: DC

## 2022-05-18 NOTE — Patient Instructions (Addendum)
Your Results:             Your most recent labs Goal  Total Cholesterol 145 < 200  Triglycerides 71 < 150  HDL (happy/good cholesterol) 45 > 40  LDL (lousy/bad cholesterol 86 < 55  Lp(a) 121 <75   Medication changes: We will start the process to get PCSK9i (Repatha, Praluent) covered by your insurance.  Once the prior authorization is complete, we will call you to let you know and confirm pharmacy information.      Praluent is a cholesterol medication that improved your body's ability to get rid of "bad cholesterol" known as LDL. It can lower your LDL up to 60%. It is an injection that is given under the skin every 2 weeks. The most common side effects of Praluent include runny nose, symptoms of the common cold, rarely flu or flu-like symptoms, back/muscle pain in about 3-4% of the patients, and redness, pain, or bruising at the injection site.    Repatha is a cholesterol medication that improved your body's ability to get rid of "bad cholesterol" known as LDL. It can lower your LDL up to 60%! It is an injection that is given under the skin every 2 weeks. The most common side effects of Repatha include runny nose, symptoms of the common cold, rarely flu or flu-like symptoms, back/muscle pain in about 3-4% of the patients, and redness, pain, or bruising at the injection site.   Lab orders: We want to repeat labs after 2-3 months.  We will send you a lab order to remind you once we get closer to that time.        Copay Assistance:  The [employer] foundation offers assistance to help pay for medication copays.  They will cover copays for all cholesterol lowering meds, including statins, fibrates, omega-3 oils, ezetimibe, Repatha, Praluent, Nexletol, Nexlizet.  The cards are usually good for $2,500 or 12 months, whichever comes first. Go to healthwellfoundation.org Click on "Apply Now" Answer questions as to whom is applying (patient or representative) Your disease fund will be  "hypercholesterolemia - Medicare access" Select the cholesterol medication you need assistance with (Repatha, Praluent, Nexlizet...) They will ask question about qualifying diagnosis - you can mark "yes"; and do you have insurance coverage.   When they ask what type of assistance you are interested in - "copay assistance" When you submit, the approval is usually within minutes.  You will need to print the card information from the site You will need to show this information to your pharmacy, they will bill your Medicare Part D plan first -then bill [employer] --for the copay.   You can also call them at [phone], although the hold times can be quite long.

## 2022-05-18 NOTE — Assessment & Plan Note (Addendum)
Assessment:  LDL goal: < 70 mg/dl last LDLc 86 mg/dl Lp(a)121 CAC score 634 (03/31/2022) Tolerates high intensity statins well without any side effects  Given high CAC score (CAD, elevated Lp(a) and hx of angina addition of PCSK9i would be reasonable next option  Discussed PCSK-9 inhibitors' cost, dosing efficacy, side effects and administration steps  Plan: Continue taking current medications- Crestor 20 mg  PA approved for Repatha through 05/11/2023 Follow up lab -FLP, Lp(a) in early April 2024

## 2022-05-18 NOTE — Telephone Encounter (Signed)
PA for Repatha has been approved through - 05/11/2023  Prescription sent to [facility] and confirm co-pay is $45 for 1 month.  Patient is informed and patient has no concern for the co-pay cost.  Follow up lab FLP, Lp(a) August 17 2022.

## 2022-05-19 ENCOUNTER — Ambulatory Visit: Payer: Medicare HMO

## 2022-05-20 DIAGNOSIS — N401 Enlarged prostate with lower urinary tract symptoms: Secondary | ICD-10-CM | POA: Diagnosis not present

## 2022-05-20 DIAGNOSIS — N281 Cyst of kidney, acquired: Secondary | ICD-10-CM | POA: Diagnosis not present

## 2022-05-20 DIAGNOSIS — R350 Frequency of micturition: Secondary | ICD-10-CM | POA: Diagnosis not present

## 2022-07-28 DIAGNOSIS — E782 Mixed hyperlipidemia: Secondary | ICD-10-CM | POA: Diagnosis not present

## 2022-07-28 DIAGNOSIS — R7303 Prediabetes: Secondary | ICD-10-CM | POA: Diagnosis not present

## 2022-07-28 DIAGNOSIS — Z0000 Encounter for general adult medical examination without abnormal findings: Secondary | ICD-10-CM | POA: Diagnosis not present

## 2022-07-28 DIAGNOSIS — G4700 Insomnia, unspecified: Secondary | ICD-10-CM | POA: Diagnosis not present

## 2022-07-28 DIAGNOSIS — Z23 Encounter for immunization: Secondary | ICD-10-CM | POA: Diagnosis not present

## 2022-07-28 DIAGNOSIS — Z1389 Encounter for screening for other disorder: Secondary | ICD-10-CM | POA: Diagnosis not present

## 2022-07-28 DIAGNOSIS — Z6828 Body mass index (BMI) 28.0-28.9, adult: Secondary | ICD-10-CM | POA: Diagnosis not present

## 2022-08-17 ENCOUNTER — Ambulatory Visit: Payer: Medicare HMO | Attending: Cardiovascular Disease

## 2022-08-17 DIAGNOSIS — E7800 Pure hypercholesterolemia, unspecified: Secondary | ICD-10-CM

## 2022-08-18 LAB — LIPOPROTEIN A (LPA): Lipoprotein (a): 89.8 nmol/L — ABNORMAL HIGH (ref <75.0)

## 2022-08-18 LAB — LIPID PANEL
Chol/HDL Ratio: 1.6 ratio (ref 0.0–5.0)
Cholesterol, Total: 83 mg/dL — ABNORMAL LOW (ref 100–199)
HDL: 52 mg/dL (ref >39)
LDL Chol Calc (NIH): 16 mg/dL (ref 0–99)
Triglycerides: 65 mg/dL (ref 0–149)
VLDL Cholesterol Cal: 15 mg/dL (ref 5–40)

## 2022-10-18 IMAGING — US US ABDOMEN COMPLETE
1 series · 13 of 25 positions shown · non-contrast
Comparison: None.

CLINICAL DATA: Left flank pain, fall several weeks ago.

EXAM:
ABDOMEN ULTRASOUND COMPLETE

[Series 1: us abdomen complete · 0.25mm/px · 13 of 92 slices shown]
[im 1/92]
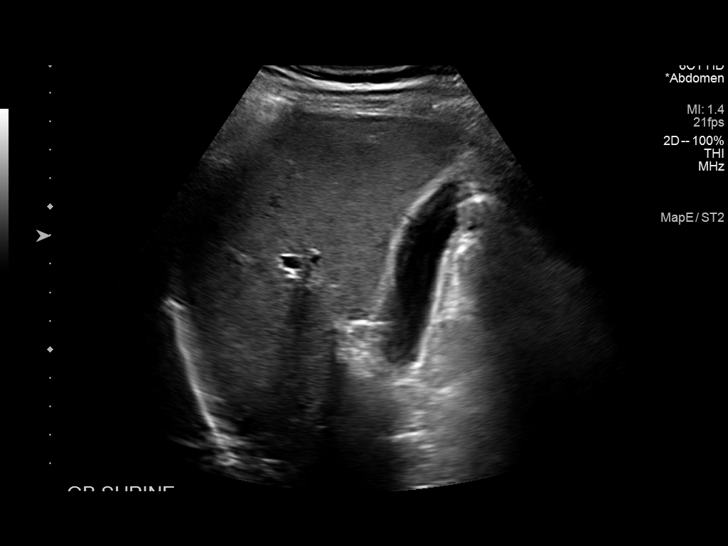
[im 8/92]
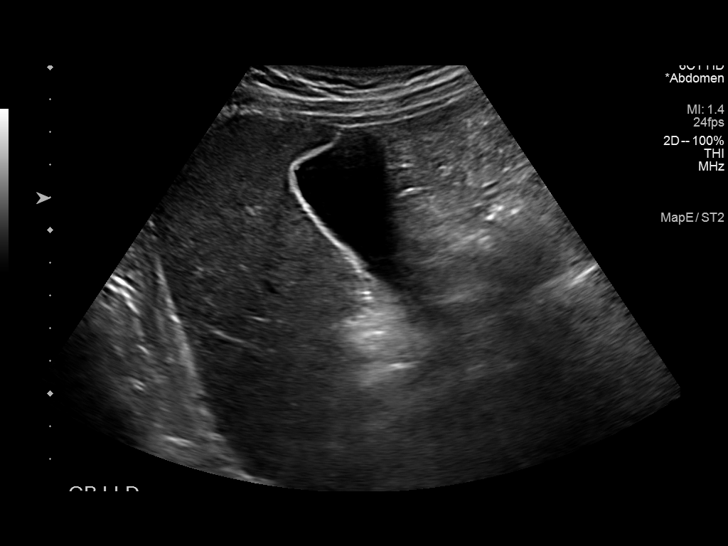
[im 16/92]
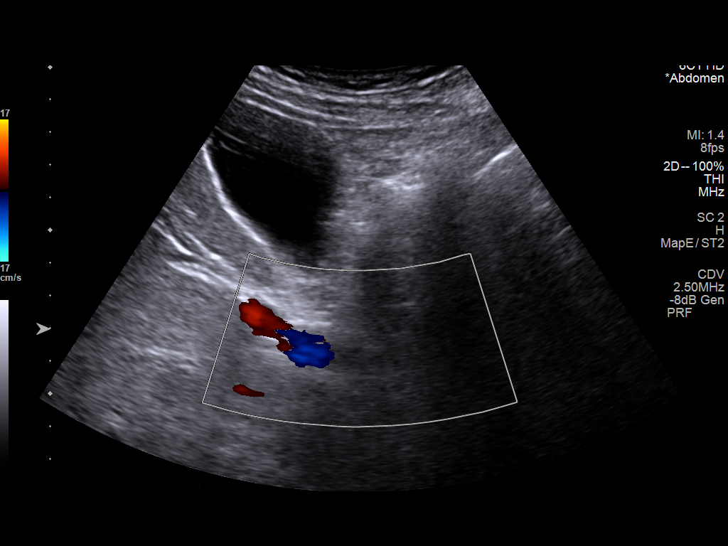
[im 23/92]
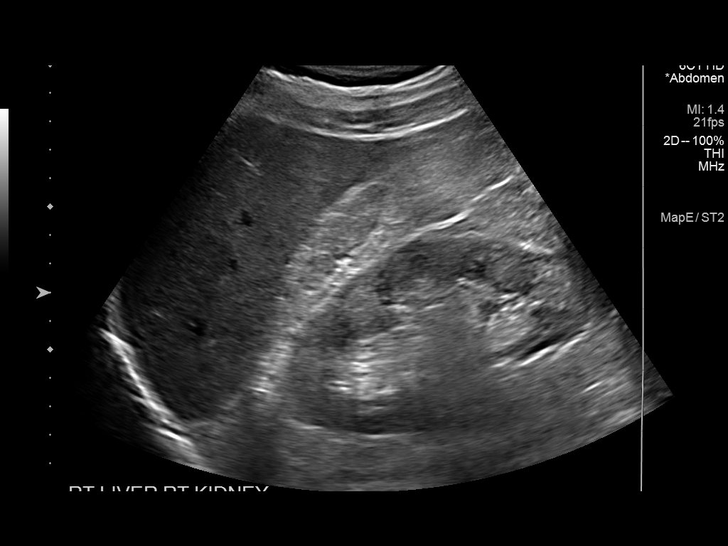
[im 31/92]
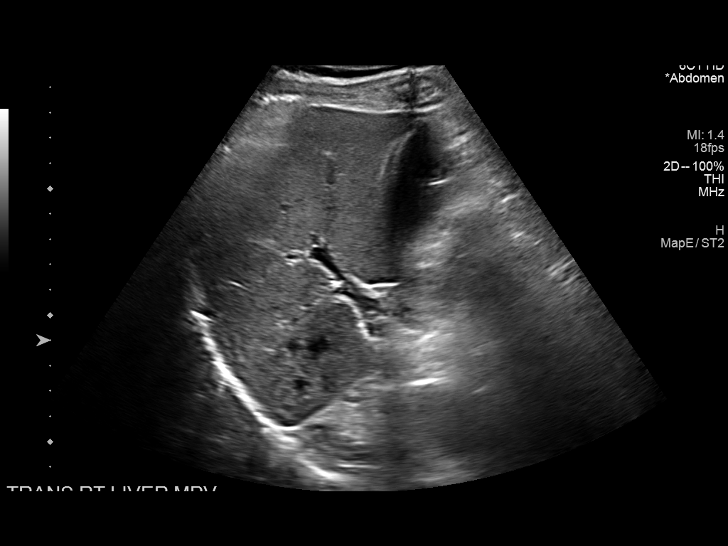
[im 38/92]
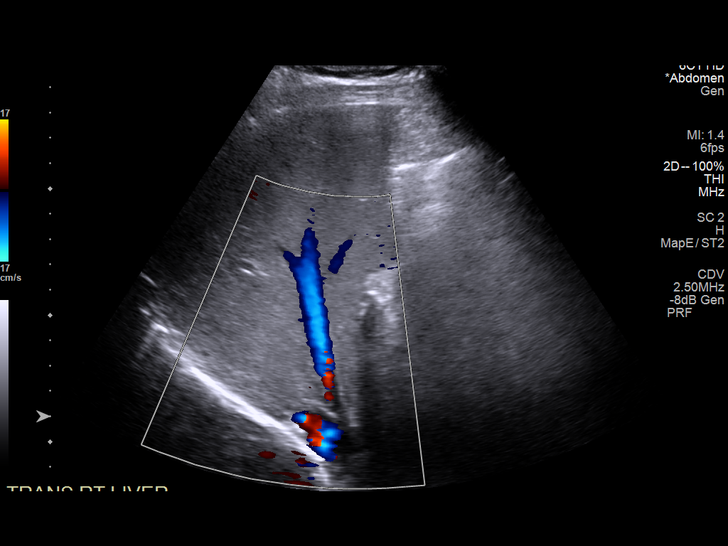
[im 46/92]
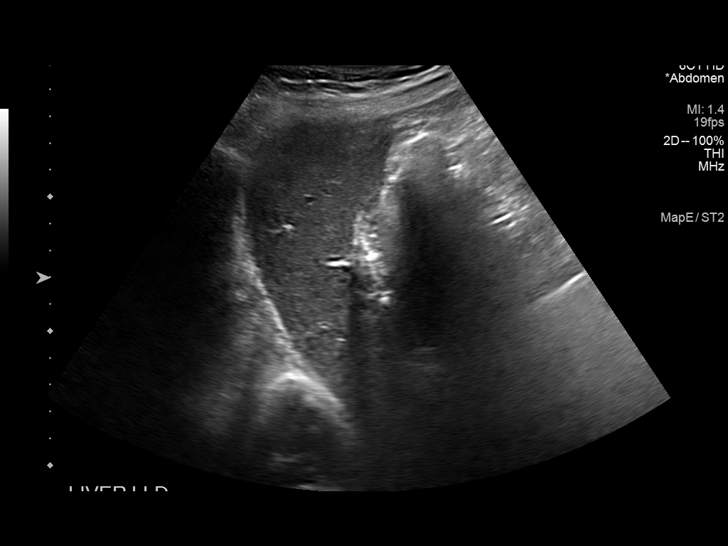
[im 54/92]
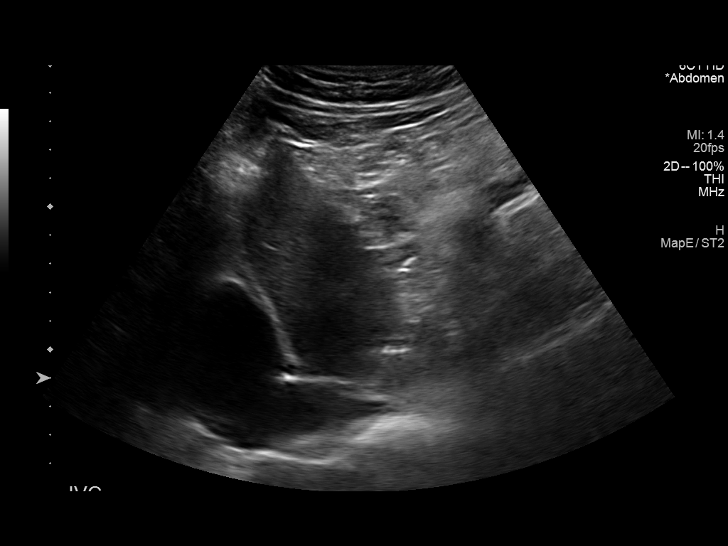
[im 61/92]
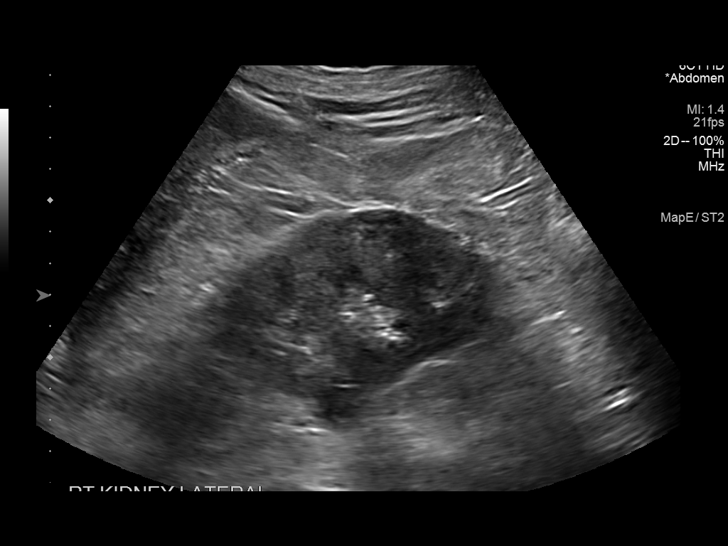
[im 69/92]
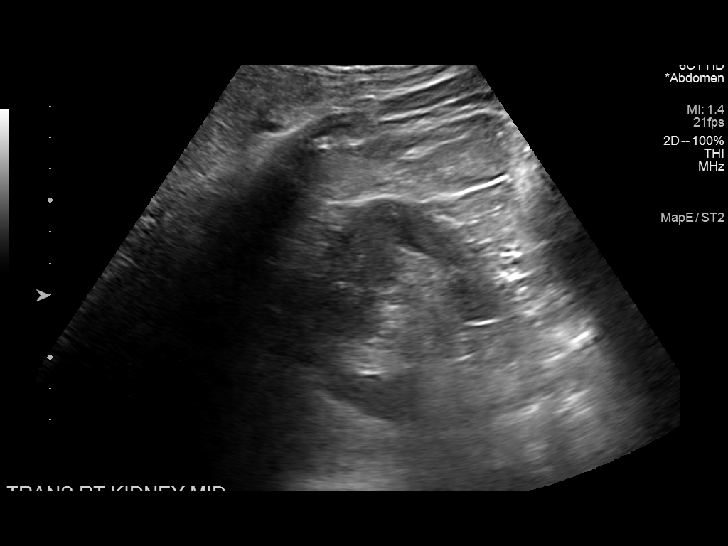
[im 76/92]
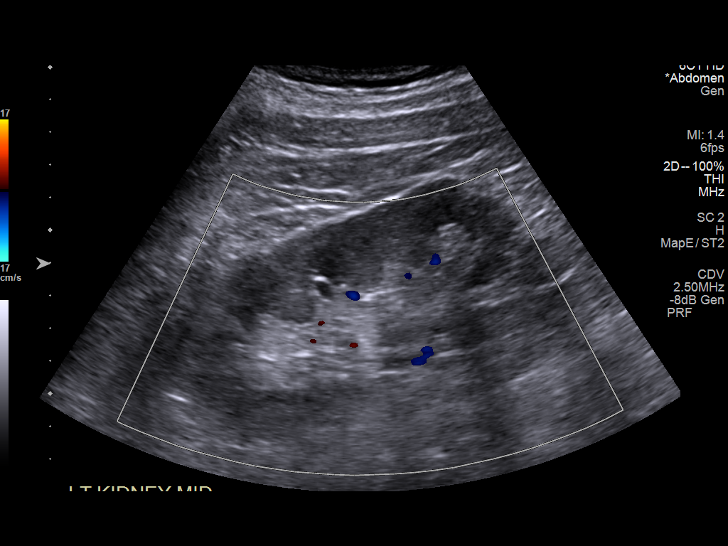
[im 84/92]
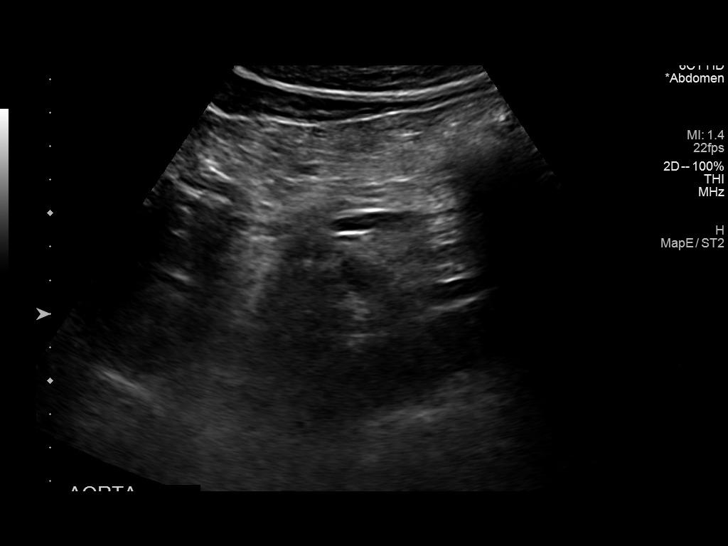
[im 92/92]
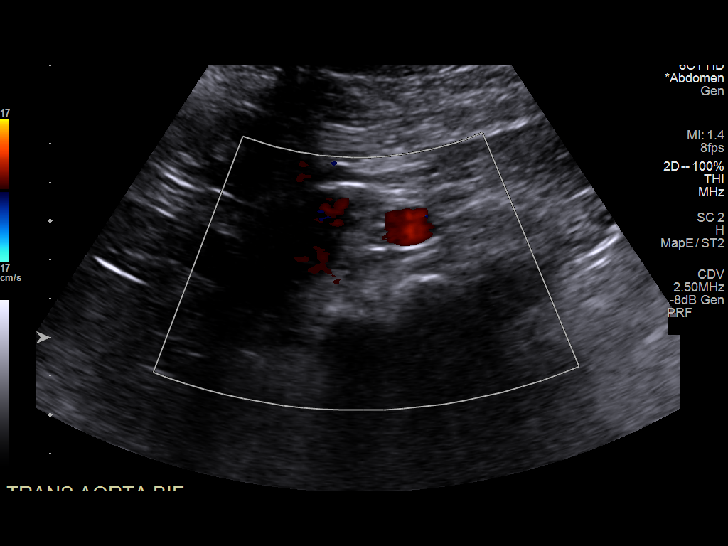

[13 of 25 positions shown; findings below may reference images not displayed]

FINDINGS: Gallbladder: No gallstones or wall thickening visualized. No
sonographic Murphy sign noted by sonographer.

Common bile duct: Diameter: 0.3 cm

Liver: No focal lesion identified. Within normal limits in
parenchymal echogenicity. Portal vein is patent on color Doppler
imaging with normal direction of blood flow towards the liver.

IVC: No abnormality visualized.

Pancreas: Visualized portion unremarkable. Pancreatic head and tail
obscured by overlying bowel gas.

Spleen: Size and appearance within normal limits.

Right Kidney: Length: 11.9 cm. Echogenicity within normal limits.
1.8 by 1.6 by 1.4 cm cystic lesion of the right kidney upper pole.
No solid mass or hydronephrosis.

Left Kidney: Length: 11.6 cm. Echogenicity within normal limits. No
mass or hydronephrosis visualized.

Abdominal aorta: No aneurysm visualized.

Other findings: Limitations include body habitus and overlying bowel
gas.
IMPRESSION: 1. 1.8 cm in long axis cyst of the right kidney upper pole.
2. No appreciable perisplenic ascites or specific sonographic
abnormality of the left kidney identified.

## 2023-01-18 ENCOUNTER — Other Ambulatory Visit: Payer: Self-pay | Admitting: *Deleted

## 2023-01-18 MED ORDER — CARVEDILOL 12.5 MG PO TABS: 12.5000 mg | ORAL_TABLET | Freq: Two times a day (BID) | ORAL | 0 refills | Status: DC

## 2023-01-20 ENCOUNTER — Telehealth: Payer: Self-pay | Admitting: Cardiovascular Disease

## 2023-01-20 DIAGNOSIS — I10 Essential (primary) hypertension: Secondary | ICD-10-CM

## 2023-01-20 DIAGNOSIS — E785 Hyperlipidemia, unspecified: Secondary | ICD-10-CM

## 2023-01-20 DIAGNOSIS — Z79899 Other long term (current) drug therapy: Secondary | ICD-10-CM

## 2023-01-20 DIAGNOSIS — E7800 Pure hypercholesterolemia, unspecified: Secondary | ICD-10-CM

## 2023-01-20 NOTE — Telephone Encounter (Signed)
Patient has his 83yr f/u scheduled in Jan, but would like [place] to put in a lab request. Patient would like his lab drawn before he sees Dr. Elease Hashimoto. Please advise.

## 2023-01-21 NOTE — Telephone Encounter (Signed)
Left message for patient to call back and schedule lab work. Orders have been placed.

## 2023-01-22 NOTE — Telephone Encounter (Signed)
Left additional message for patient to call and schedule labs or send message through MyChart.

## 2023-01-29 DIAGNOSIS — Z23 Encounter for immunization: Secondary | ICD-10-CM | POA: Diagnosis not present

## 2023-01-29 DIAGNOSIS — E782 Mixed hyperlipidemia: Secondary | ICD-10-CM | POA: Diagnosis not present

## 2023-01-29 DIAGNOSIS — I10 Essential (primary) hypertension: Secondary | ICD-10-CM | POA: Diagnosis not present

## 2023-01-29 DIAGNOSIS — Z6828 Body mass index (BMI) 28.0-28.9, adult: Secondary | ICD-10-CM | POA: Diagnosis not present

## 2023-01-29 NOTE — Telephone Encounter (Signed)
Returned call to patient. Labs scheduled for 04/28/23 per request.

## 2023-02-05 ENCOUNTER — Other Ambulatory Visit (HOSPITAL_BASED_OUTPATIENT_CLINIC_OR_DEPARTMENT_OTHER): Payer: Self-pay

## 2023-02-05 MED ORDER — COVID-19 MRNA VAC-TRIS(PFIZER) 30 MCG/0.3ML IM SUSY
0.3000 mL | PREFILLED_SYRINGE | Freq: Once | INTRAMUSCULAR | 0 refills | Status: AC
  Filled 2023-02-05: qty 0.3, 1d supply, fill #0

## 2023-02-15 ENCOUNTER — Telehealth: Payer: Self-pay | Admitting: Cardiovascular Disease

## 2023-02-15 NOTE — Telephone Encounter (Signed)
Pt c/o BP issue: STAT if pt c/o blurred vision, one-sided weakness or slurred speech  1. What are your last 5 BP readings?  60/?  -  Saturday during a car show when his vision went dark  90/70  - while with EMS  135/70 - later that night  145/77 - later that night  130/70 - yesterday   2. Are you having any other symptoms (ex. Dizziness, headache, blurred vision, passed out)?  Not anymore.   He states prior to passing out he had blurry vision and dizziness   3. What is your BP issue?  Pt called in stating he an episode of low BP and was told to contact Dr. Elease Hashimoto about it.

## 2023-02-15 NOTE — Telephone Encounter (Signed)
Called patient with Dr. Harvie Bridge advisement. Made patient an appointment with Suann Larry NP this week.

## 2023-02-15 NOTE — Telephone Encounter (Signed)
Called patient about message. Patient stated on Saturday around 11:00 am while he was walking around at an outdoor car show he had dizziness and saw sparks of light in his vision for about 2 minutes. Patient stated he sat down at a table and put his head down. At that time, he became unresponsive and his family called EMS. EMS checked on patient, their first BP ready was low with SBP of 60. Patient's wife stated patient was unconscious for 7 to 10 minutes. Patient was taken to the ambulance where his BP finally came up and was aware of his surroundings. Patient stated he had breakfast that morning and 2 glasses of orange juice. Patient state he had no water that day and had been out in the sun that he had felt pretty warm. Patient stated EMS released him once his BP was back up and they ran an EKG. Patient stated his HR was in the 60's, and they did not advise to go to hospital. Will forward to Dr. Elease Hashimoto so he is aware.

## 2023-02-16 NOTE — Progress Notes (Unsigned)
Cardiology Office Note:  .   Date:  02/17/2023  ID:  Tim James, DOB 1946-03-31, MRN [id number] PCP: Ileana Ladd, MD (Inactive)  [facility] Providers Cardiologist:  Kristeen Miss, MD {  History of Present Illness: Tim James   Tim James is a 77 y.o. male with a past medical history of hypertension, chest pain, fatigue, and hyperlipidemia here for follow-up appointment.  Was followed by Dr. Elease Hashimoto since 2014.  He was last seen November 2023 and his blood pressure was mildly elevated.  Otherwise doing okay at that appointment.  Today, he tells me that he had a vasovagal event while at a car show.  He got real lightheaded and dizzy and his blood pressure dropped to 60 systolic.  EMS was there and did an EKG.  Blood pressure quickly recovered to 90 systolic and he felt better.  This is only happened 1 other time to him when he was 77 years old in an airplane and started to feel faint and dizzy.  Luckily, no prodrome of palpitations.  No chest pain.  Today, he endorses a little bit of a cold.  Sometimes some chest congestion but no shortness of breath.  He has some nasal drainage and pressure.  No recent fatigue.  He endorses occasional short-lived fluttering right before bed but nothing sustained.  Reports no shortness of breath nor dyspnea on exertion. Reports no chest pain, pressure, or tightness. No edema, orthopnea, PND.   ROS: Pertinent ROS in HPI  Studies Reviewed: Tim James   EKG Interpretation Date/Time:  Wednesday February 17 2023 09:05:33 EDT Ventricular Rate:  62 PR Interval:  144 QRS Duration:  92 QT Interval:  392 QTC Calculation: 397 R Axis:   41  Text Interpretation: Normal sinus rhythm Normal ECG No previous ECGs available Confirmed by Jari Favre [phone]) on 02/17/2023 9:10:18 AM    ADDENDUM REPORT: 12/27/2018 10:32   CLINICAL DATA:  Risk stratification   EXAM: Coronary Calcium Score   TECHNIQUE: The patient was scanned on a [employer] scanner.  Axial non-contrast 3 mm slices were carried out through the heart. The data set was analyzed on a dedicated work station and scored using the Agatson method.   FINDINGS: Non-cardiac: See separate report from [facility].   Ascending Aorta: Normal Caliber. No calcifications. Calcifications noted in Aortic annulus.   Pericardium: Normal   Coronary arteries: Normal coronary origins.   IMPRESSION: Coronary calcium score of 634. This was 78th percentile for age and sex matched control.   Traci Turner     Electronically Signed   By: Armanda Magic   On: 12/27/2018 10:32      Physical Exam:   VS:  BP (!) 148/70   Pulse 69   Ht 5\' 11"  (1.803 m)   Wt 203 lb 12.8 oz (92.4 kg)   SpO2 99%   BMI 28.42 kg/m    Wt Readings from Last 3 Encounters:  02/17/23 203 lb 12.8 oz (92.4 kg)  03/31/22 197 lb (89.4 kg)  03/03/21 197 lb (89.4 kg)    GEN: Well nourished, well developed in no acute distress NECK: No JVD; No carotid bruits CARDIAC: RRR, no murmurs, rubs, gallops RESPIRATORY:  Clear to auscultation without rales, wheezing or rhonchi  ABDOMEN: Soft, non-tender, non-distended EXTREMITIES:  No edema; No deformity   ASSESSMENT AND PLAN: .   1.  Vasovagal episode  -Isolated incident where EMS was available and blood pressure was 60 systolic.  Came up quickly to 90 systolic -  EKG from EMS reviewed today, normal sinus rhythm with no ischemic changes -Encouraged hydration and keeping salty snacks on him just in case he feels like his blood pressure is dropping  2.  Chest pain -none recently  3.  Fatigue -none recently  4.  Hyperlipidemia -he will get a lipid panel today  -LDL was 16 back in April -Continue current medication regimen  5. Hypertension -BP on repeat was 138/72 -continue current medications: Carvedilol 12.5 mg twice daily, Repatha 140 mg every 14 days, Crestor 20 mg daily      Dispo: He has follow-up already arranged for January with Dr.  Elease Hashimoto  Signed, Sharlene Dory, PA-C

## 2023-02-17 ENCOUNTER — Ambulatory Visit: Payer: Medicare HMO | Attending: Physician Assistant | Admitting: Physician Assistant

## 2023-02-17 ENCOUNTER — Encounter: Payer: Self-pay | Admitting: Physician Assistant

## 2023-02-17 ENCOUNTER — Ambulatory Visit: Payer: Medicare HMO

## 2023-02-17 VITALS — BP 148/70 | HR 69 | Ht 71.0 in | Wt 203.8 lb

## 2023-02-17 DIAGNOSIS — I10 Essential (primary) hypertension: Secondary | ICD-10-CM

## 2023-02-17 DIAGNOSIS — E785 Hyperlipidemia, unspecified: Secondary | ICD-10-CM | POA: Diagnosis not present

## 2023-02-17 DIAGNOSIS — R5383 Other fatigue: Secondary | ICD-10-CM | POA: Diagnosis not present

## 2023-02-17 DIAGNOSIS — E7800 Pure hypercholesterolemia, unspecified: Secondary | ICD-10-CM | POA: Diagnosis not present

## 2023-02-17 DIAGNOSIS — R079 Chest pain, unspecified: Secondary | ICD-10-CM

## 2023-02-17 DIAGNOSIS — Z79899 Other long term (current) drug therapy: Secondary | ICD-10-CM

## 2023-02-17 NOTE — Patient Instructions (Signed)
Medication Instructions:  Your physician recommends that you continue on your current medications as directed. Please refer to the Current Medication list given to you today. *If you need a refill on your cardiac medications before your next appointment, please call your pharmacy*   Lab Work: Complete labs today (ALREADY ORDERED-LIPIDS, LFT, BMET)  If you have labs (blood work) drawn today and your tests are completely normal, you will receive your results only by: MyChart Message (if you have MyChart) OR A paper copy in the mail If you have any lab test that is abnormal or we need to change your treatment, we will call you to review the results.   Testing/Procedures: None ordered   Follow-Up: At [facility], you and your health needs are our priority.  As part of our continuing mission to provide you with exceptional heart care, we have created designated Provider Care Teams.  These Care Teams include your primary Cardiologist (physician) and Advanced Practice Providers (APPs -  Physician Assistants and Nurse Practitioners) who all work together to provide you with the care you need, when you need it.  We recommend signing up for the patient portal called "MyChart".  Sign up information is provided on this After Visit Summary.  MyChart is used to connect with patients for Virtual Visits (Telemedicine).  Patients are able to view lab/test results, encounter notes, upcoming appointments, etc.  Non-urgent messages can be sent to your provider as well.   To learn more about what you can do with MyChart, go to [email or web address].    Your next appointment:   FOLLOW UP AS SCHEDULED   Provider:   Kristeen Miss, MD     Other Instructions

## 2023-02-18 LAB — BASIC METABOLIC PANEL
BUN/Creatinine Ratio: 17 (ref 10–24)
BUN: 16 mg/dL (ref 8–27)
CO2: 26 mmol/L (ref 20–29)
Calcium: 9.2 mg/dL (ref 8.6–10.2)
Chloride: 99 mmol/L (ref 96–106)
Creatinine, Ser: 0.96 mg/dL (ref 0.76–1.27)
Glucose: 118 mg/dL — ABNORMAL HIGH (ref 70–99)
Potassium: 4.7 mmol/L (ref 3.5–5.2)
Sodium: 137 mmol/L (ref 134–144)
eGFR: 82 mL/min/{1.73_m2} (ref >59)

## 2023-02-18 LAB — LIPID PANEL
Chol/HDL Ratio: 1.6 {ratio} (ref 0.0–5.0)
Cholesterol, Total: 90 mg/dL — ABNORMAL LOW (ref 100–199)
HDL: 56 mg/dL (ref >39)
LDL Chol Calc (NIH): 20 mg/dL (ref 0–99)
Triglycerides: 62 mg/dL (ref 0–149)
VLDL Cholesterol Cal: 14 mg/dL (ref 5–40)

## 2023-02-18 LAB — ALT: ALT: 23 [IU]/L (ref 0–44)

## 2023-04-14 DIAGNOSIS — H52223 Regular astigmatism, bilateral: Secondary | ICD-10-CM | POA: Diagnosis not present

## 2023-04-28 ENCOUNTER — Other Ambulatory Visit: Payer: Medicare HMO

## 2023-05-07 ENCOUNTER — Other Ambulatory Visit (HOSPITAL_COMMUNITY): Payer: Self-pay

## 2023-05-07 ENCOUNTER — Telehealth: Payer: Self-pay | Admitting: Pharmacy Technician

## 2023-05-07 ENCOUNTER — Telehealth: Payer: Self-pay | Admitting: Cardiovascular Disease

## 2023-05-07 MED ORDER — REPATHA SURECLICK 140 MG/ML ~~LOC~~ SOAJ: 140.0000 mg | SUBCUTANEOUS | 3 refills | Status: DC

## 2023-05-07 NOTE — Telephone Encounter (Signed)
*  STAT* If patient is at the pharmacy, call can be transferred to refill team.   1. Which medications need to be refilled? (please list name of each medication and dose if known)   Evolocumab (REPATHA SURECLICK) 140 MG/ML SOAJ   2. Would you like to learn more about the convenience, safety, & potential cost savings by using the [facility]?   3. Are you open to using the [facility] (Type [facility]. ).  4. Which pharmacy/location (including street and city if local pharmacy) is medication to be sent to?  [id number] - [place], [place] - [address]   5. Do they need a 30 day or 90 day supply?   90 day  Patient stated he still has some medication.

## 2023-05-07 NOTE — Telephone Encounter (Signed)
Pharmacy Patient Advocate Encounter   Received notification from Pt Calls Messages that prior authorization for repatha is required/requested.   Insurance verification completed.   The patient is insured through [employer] .   Per test claim: Refill too soon. PA is not needed at this time. Medication was filled 05/07/23. Next eligible fill date is 07/05/23. PA on file was extended until 05/10/24.

## 2023-05-26 ENCOUNTER — Telehealth: Payer: Self-pay | Admitting: Cardiovascular Disease

## 2023-05-26 ENCOUNTER — Other Ambulatory Visit: Payer: Self-pay

## 2023-05-26 NOTE — Telephone Encounter (Signed)
 Pt has upcoming appt with Nahser and wants to know if he needs labs done prior. Requesting cb

## 2023-05-26 NOTE — Telephone Encounter (Signed)
 Returned call to patient. Labs are up to date as of 02/2023. No labs needed at this time.

## 2023-05-29 ENCOUNTER — Other Ambulatory Visit: Payer: Self-pay | Admitting: Cardiovascular Disease

## 2023-06-03 NOTE — Progress Notes (Signed)
Tim James Date of Birth  12/06/1945       [facility]     [address], [address]   [place], [place]  [address]     [phone]          Problem List: 1.  Hypertension  2.  Chest pain  3.  Fatigue 4. Hyperlipidemia   Tim James is a 78 y.o. gentleman with hx of HTN and hyperlipidemia.  He has done well.  He does not exercise as much as he would like.  He has not had any chest pain or dyspnea.  Nov.  10, 2014:  Tim James is doing well.   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    He needs to walk more.     Nov. 11, 2015:  Tim James is seen back today for follow up of his HTN and hyperlipidemia. He still teaches middle school math.  Wants to work until 72. No Cp or dyspnea.   Walks around at school.  Walks the dogs at night. Has has lost about 20 lbs since Sept.  Feels better.  Feb. 1, 2017:  Doing well.  BP is a bit high this am - some stresses this am .  Avoiding salt. Has not been exercising much , lots of left knee pain   Jan. 12, 2018:  Tim James is doing well.   Has had some chest pressure for the past month. The pain moves around , from 1 side to another, to the back Is not worsened with activity. Has a cough, has had a minor cold. Has not been walking much  Is working at a new job - now work in [place] .   Sep 24, 2017:  Tim James is seen today for follow-up of his hypertension, chest pain, and fatigue. He also has a history of hyperlipidemia.  Recent labs reveal a total cholesterol of 214.  His HDL is 53.9.  The LDL is 139.  Triglyceride level is 103.   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 13, 2021:  Tim James is seen today for follow up of his HTN, HLD .  Staying active ,  Has retired complely now.   Stays busy Has a place near [facility].  Labs from last week  are reviewed and look great.  Has had both covid vaccines.   Wife and daughter had break through covid months after getting vaccines.   Oct. 24, 2022: Tim James is seen today for follow up of his HTN, HLD  Retired from teaching at the beginning of [employer] lots of time at his cabin in [place] .  No CP or dyspnea,   Gets 8000 steps a day  Lipids from January look KO .  LDL iis 106 Coronary artery calcium score from August, 2020 was 634 which places him in the 107 percentile for age and sex matched controls.  Based on his lipid levels from earlier this year we will increase his rosuvastatin to 20 mg a day.  He has had some muscle aches in the past.  I have encouraged him to call us if he has recurrent muscle aches on the higher dose of rosuvastatin.  If that is the case we will refer him to the lipid clinic for  consideration of a PCSK9 inhibitor or inclisaran  Nov. 21, 2023 Tim James is seen today today  BP is mildly elevated.    Current Outpatient Medications on File Prior to Visit  Medication Sig Dispense Refill   AMINO ACIDS PO Take 1 tablet by mouth daily.     Ascorbic Acid (VITAMIN C) 100 MG tablet Take 100 mg by mouth daily.       B Complex-C (B-COMPLEX WITH VITAMIN C) tablet Take 1 tablet by mouth daily.     carvedilol (COREG) 12.5 MG tablet Take 1 tablet (12.5 mg total) by mouth 2 (two) times daily. 180 tablet 0   Cholecalciferol (VITAMIN D-3 PO) Take 1,000 Units by mouth daily.     Coenzyme Q10 (CO Q 10) 100 MG CAPS Take 1 capsule by mouth daily at 6 (six) AM.     COVID-19 mRNA vaccine 2023-2024 (COMIRNATY) SUSP injection Inject into the muscle. 0.3 mL 0   COVID-19 mRNA vaccine, Moderna, 100 MCG/0.5ML injection Inject into the muscle. 0.25 mL 0   EPINEPHrine 0.3 mg/0.3 mL IJ SOAJ injection Inject 1 Dose into the muscle as needed.     Evolocumab (REPATHA SURECLICK) 140 MG/ML SOAJ Inject 140 mg into the skin every 14 (fourteen) days. 6 mL 3   fish oil-omega-3 fatty acids  1000 MG capsule Take 2 g by mouth daily.       Flaxseed, Linseed, 1000 MG CAPS Take 1,000 mg by mouth daily.     glucosamine-chondroitin 500-400 MG tablet Take 2 tablets by mouth daily.      rosuvastatin (CRESTOR) 20 MG tablet TAKE 1 TABLET BY MOUTH EVERY DAY 90 tablet 2   saw palmetto 160 MG capsule Take 160 mg by mouth 2 (two) times daily.       Turmeric 500 MG TABS Take 1 tablet by mouth daily at 6 (six) AM.     vitamin E 400 UNIT capsule Take 400 Units by mouth daily.       No current facility-administered medications on file prior to visit.    No Known Allergies  Past Medical History:  Diagnosis Date   Fatigue    Hyperlipidemia    Hypertension     Past Surgical History:  Procedure Laterality Date   APPENDECTOMY     TONSILLECTOMY      Social History   Tobacco Use  Smoking Status Never  Smokeless Tobacco Never    Social History   Substance and Sexual Activity  Alcohol Use No    Family History  Problem Relation Age of Onset   Hypertension Mother    Angina Mother    Hypertension Father    Hypertension Sister    Hypertension Brother    Hypertension Sister     Reviw of Systems:  Noted in current history, otherwise review of systems is negative.    Physical Exam: Blood pressure 139/84, pulse 81, height 5' 11.5" (1.816 m), weight 200 lb 9.6 oz (91 kg), SpO2 99%.       GEN:  Well nourished, well developed in no acute distress HEENT: Normal NECK: No JVD; No carotid bruits LYMPHATICS: No lymphadenopathy CARDIAC: RRR , no murmurs, rubs, gallops RESPIRATORY:  Clear to auscultation without rales, wheezing or rhonchi  ABDOMEN: Soft, non-tender, non-distended MUSCULOSKELETAL:  No edema; No deformity  SKIN: Warm and dry NEUROLOGIC:  Alert and oriented x 3    EKG:  EKG Interpretation Date/Time:  Friday June 04 2023 09:32:40 EST Ventricular Rate:  62 PR Interval:  146 QRS Duration:  78 QT Interval:  384 QTC Calculation: 389 R Axis:   19  Text  Interpretation: Normal sinus rhythm Normal ECG When compared with ECG of 17-Feb-2023 09:05, No significant change was found Confirmed by Kristeen Miss ([id number]) on 06/04/2023 5:23:07 PM      Assessment / Plan:   1.  Hypertension  - BP is well controlled.   Cont meds    2.  Hyperlipidemia:  - his last LDL is 20  Cont rosuvastatin and repatha     Follow up in 1 year with Dr. Zackery Barefoot, MD  06/04/2023 5:23 PM    [facility] [address] [place],  [address] [place], [place]  [address] Pager [phone] Phone: [phone]; Fax: [phone]    Date of Birth  01/13/1946       [facility]     [address]   [place], [place]  [address]     [phone]          Problem List: 1.  Hypertension  2.  Chest pain  3.  Fatigue 4. Hyperlipidemia   Tim James is a 78 y.o. gentleman with hx of HTN and hyperlipidemia.  He has done well.  He does not exercise as much as he would like.  He has not had any chest pain or dyspnea.  Nov.  10, 2014:  Tim James is doing well.   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    He needs to walk more.     Nov. 11, 2015:  Tim James is seen back today for follow up of his HTN and hyperlipidemia. He still teaches middle school math.  Wants to work until 72. No Cp or dyspnea.   Walks around at school.  Walks the dogs at night. Has has lost about 20 lbs since Sept.  Feels better.  Feb. 1, 2017:  Doing well.  BP is a bit high this am - some stresses this am .  Avoiding salt. Has not been exercising much , lots of left knee pain   Jan. 12, 2018:  Tim James is doing well.   Has had some chest pressure for the past month. The pain moves around , from 1 side to another, to the back Is not worsened with activity. Has a cough, has had a minor cold. Has not been walking much  Is working at a new job - now work in [place] .   Sep 24, 2017:  Tim James is seen today for follow-up  of his hypertension, chest pain, and fatigue. He also has a history of hyperlipidemia.  Recent labs reveal a total cholesterol of 214.  His HDL is 53.9.  The LDL is 139.  Triglyceride level is 103.   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 13, 2021:  Tim James is seen today for follow up of his HTN, HLD .  Staying active ,  Has retired complely now.   Stays busy Has a place near [facility].  Labs from last week are reviewed and look great.  Has had both covid vaccines.   Wife and daughter had break through covid months after getting vaccines.   Oct. 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  No CP or dyspnea,   Gets 8000 steps a day  Lipids from January look good  .  LDL is 106 Coronary artery calcium score from August, 2020 was 634 which places him in the 76 percentile for age and sex matched controls.  Based on his lipid levels from earlier this year we will increase his rosuvastatin to 20 mg a day.  He has had some muscle aches in the past.  I have encouraged him to call us if he has recurrent muscle aches on the higher dose of rosuvastatin.  If that is the case we will refer him to the lipid clinic for consideration of a PCSK9 inhibitor or inclisaran  Nov. 21, 2023 Tim James is seen for follow up of his HTN, HLD  Elevated coronary calcium score    Jan. 24, 2025   Tim James is seen for follow up of his HTN,HLD  Elevate CAC  His BP is typically well controlled.  Is selling his house on [address] ( 3 [address] , 2 baths ) , sunroom  Is moving to a town house on [address]   Had an episode of near syncope ( possible syncope) this past summer .  Did not go to the hospital  Was checked out by EMS .  Has not recurred .   Was likely volume depleted   Lipids from February 17, 2023  Chol = 90 HDL = 56 LDL = 20 Trigs = 62       Current Outpatient Medications on File Prior to Visit  Medication Sig Dispense Refill   AMINO ACIDS PO Take 1 tablet by mouth daily.     Ascorbic Acid (VITAMIN C) 100 MG tablet Take 100 mg by mouth daily.       B Complex-C (B-COMPLEX WITH VITAMIN C) tablet Take 1 tablet by mouth daily.     carvedilol (COREG) 12.5 MG tablet Take 1 tablet (12.5 mg total) by mouth 2 (two) times daily. 180 tablet 0   Cholecalciferol (VITAMIN D-3 PO) Take 1,000 Units by mouth daily.     Coenzyme Q10 (CO Q 10) 100 MG CAPS Take 1 capsule by mouth daily at 6 (six) AM.     COVID-19 mRNA vaccine 2023-2024 (COMIRNATY) SUSP injection Inject into the muscle. 0.3 mL 0   COVID-19 mRNA vaccine, Moderna, 100 MCG/0.5ML injection Inject into the muscle. 0.25 mL 0   EPINEPHrine 0.3 mg/0.3 mL IJ SOAJ injection Inject 1 Dose into the muscle as needed.     Evolocumab (REPATHA SURECLICK) 140 MG/ML SOAJ Inject 140 mg into the skin every 14 (fourteen) days. 6 mL 3   fish oil-omega-3 fatty acids 1000 MG capsule Take 2 g by mouth daily.       Flaxseed, Linseed, 1000 MG CAPS Take 1,000 mg by mouth daily.     glucosamine-chondroitin 500-400 MG tablet Take 2 tablets by mouth daily.      rosuvastatin (CRESTOR) 20 MG tablet TAKE 1 TABLET BY MOUTH EVERY DAY 90 tablet 2   saw palmetto 160 MG capsule Take 160 mg by mouth 2 (two) times daily.       Turmeric 500 MG TABS Take 1 tablet by mouth daily at 6 (six) AM.     vitamin E 400 UNIT capsule Take 400 Units by mouth daily.       No current facility-administered medications on file prior to visit.  No Known Allergies  Past Medical History:  Diagnosis Date   Fatigue    Hyperlipidemia    Hypertension     Past Surgical History:  Procedure Laterality Date   APPENDECTOMY     TONSILLECTOMY      Social History   Tobacco Use  Smoking Status Never  Smokeless Tobacco  Never    Social History   Substance and Sexual Activity  Alcohol Use No    Family History  Problem Relation Age of Onset   Hypertension Mother    Angina Mother    Hypertension Father    Hypertension Sister    Hypertension Brother    Hypertension Sister     Reviw of Systems:  Noted in current history, otherwise review of systems is negative.   Physical Exam: Blood pressure 139/84, pulse 81, height 5' 11.5" (1.816 m), weight 200 lb 9.6 oz (91 kg), SpO2 99%.       GEN:  Well nourished, well developed in no acute distress HEENT: Normal NECK: No JVD; No carotid bruits LYMPHATICS: No lymphadenopathy CARDIAC: RRR , no murmurs, rubs, gallops RESPIRATORY:  Clear to auscultation without rales, wheezing or rhonchi  ABDOMEN: Soft, non-tender, non-distended MUSCULOSKELETAL:  No edema; No deformity  SKIN: Warm and dry NEUROLOGIC:  Alert and oriented x 3    EKG:    Assessment / Plan:   1.  Hypertension  -   2.  Hyperlipidemia:     Kristeen Miss, MD  06/04/2023 5:23 PM    [facility] [address] [place],  [address] [place], [place]  [address] Pager [phone] Phone: [phone]; Fax: [phone]

## 2023-06-04 ENCOUNTER — Encounter: Payer: Self-pay | Admitting: Cardiovascular Disease

## 2023-06-04 ENCOUNTER — Ambulatory Visit: Payer: Medicare HMO | Attending: Cardiovascular Disease | Admitting: Cardiovascular Disease

## 2023-06-04 VITALS — BP 139/84 | HR 81 | Ht 71.5 in | Wt 200.6 lb

## 2023-06-04 DIAGNOSIS — R0789 Other chest pain: Secondary | ICD-10-CM | POA: Diagnosis not present

## 2023-06-04 DIAGNOSIS — E782 Mixed hyperlipidemia: Secondary | ICD-10-CM

## 2023-06-04 DIAGNOSIS — I2510 Atherosclerotic heart disease of native coronary artery without angina pectoris: Secondary | ICD-10-CM

## 2023-06-04 NOTE — Patient Instructions (Signed)
Follow-Up: At [facility], you and your health needs are our priority.  As part of our continuing mission to provide you with exceptional heart care, we have created designated Provider Care Teams.  These Care Teams include your primary Cardiologist (physician) and Advanced Practice Providers (APPs -  Physician Assistants and Nurse Practitioners) who all work together to provide you with the care you need, when you need it.  We recommend signing up for the patient portal called "MyChart".  Sign up information is provided on this After Visit Summary.  MyChart is used to connect with patients for Virtual Visits (Telemedicine).  Patients are able to view lab/test results, encounter notes, upcoming appointments, etc.  Non-urgent messages can be sent to your provider as well.   To learn more about what you can do with MyChart, go to [email or web address].    Your next appointment:   1 year(s)  Provider:   Royann Shivers, MD  Other Instructions   1st Floor: - Lobby - Registration  - Pharmacy  - Lab - Cafe  2nd Floor: - PV Lab - Diagnostic Testing (echo, CT, nuclear med)  3rd Floor: - Vacant  4th Floor: - TCTS (cardiothoracic surgery) - AFib Clinic - Structural Heart Clinic - Vascular Surgery  - Vascular Ultrasound  5th Floor: - [facility] (general and EP) - Clinical Pharmacy for coumadin, hypertension, lipid, weight-loss medications, and med management appointments    Valet parking services will be available as well.

## 2023-06-13 ENCOUNTER — Other Ambulatory Visit: Payer: Self-pay | Admitting: Cardiovascular Disease

## 2023-09-03 DIAGNOSIS — J069 Acute upper respiratory infection, unspecified: Secondary | ICD-10-CM | POA: Diagnosis not present

## 2023-10-09 DIAGNOSIS — I10 Essential (primary) hypertension: Secondary | ICD-10-CM | POA: Diagnosis not present

## 2023-10-09 DIAGNOSIS — E782 Mixed hyperlipidemia: Secondary | ICD-10-CM | POA: Diagnosis not present

## 2023-11-08 DIAGNOSIS — E782 Mixed hyperlipidemia: Secondary | ICD-10-CM | POA: Diagnosis not present

## 2023-11-08 DIAGNOSIS — I10 Essential (primary) hypertension: Secondary | ICD-10-CM | POA: Diagnosis not present

## 2023-12-09 DIAGNOSIS — I10 Essential (primary) hypertension: Secondary | ICD-10-CM | POA: Diagnosis not present

## 2023-12-09 DIAGNOSIS — E782 Mixed hyperlipidemia: Secondary | ICD-10-CM | POA: Diagnosis not present

## 2024-01-09 DIAGNOSIS — E782 Mixed hyperlipidemia: Secondary | ICD-10-CM | POA: Diagnosis not present

## 2024-01-09 DIAGNOSIS — I10 Essential (primary) hypertension: Secondary | ICD-10-CM | POA: Diagnosis not present

## 2024-01-18 DIAGNOSIS — Z0000 Encounter for general adult medical examination without abnormal findings: Secondary | ICD-10-CM | POA: Diagnosis not present

## 2024-01-18 DIAGNOSIS — I10 Essential (primary) hypertension: Secondary | ICD-10-CM | POA: Diagnosis not present

## 2024-01-18 DIAGNOSIS — Z23 Encounter for immunization: Secondary | ICD-10-CM | POA: Diagnosis not present

## 2024-01-18 DIAGNOSIS — R7303 Prediabetes: Secondary | ICD-10-CM | POA: Diagnosis not present

## 2024-01-18 DIAGNOSIS — E782 Mixed hyperlipidemia: Secondary | ICD-10-CM | POA: Diagnosis not present

## 2024-02-02 ENCOUNTER — Other Ambulatory Visit (HOSPITAL_BASED_OUTPATIENT_CLINIC_OR_DEPARTMENT_OTHER): Payer: Self-pay

## 2024-02-02 MED ORDER — COMIRNATY 30 MCG/0.3ML IM SUSY
0.3000 mL | PREFILLED_SYRINGE | Freq: Once | INTRAMUSCULAR | 0 refills | Status: AC
  Filled 2024-02-02: qty 0.3, 1d supply, fill #0

## 2024-02-08 DIAGNOSIS — E782 Mixed hyperlipidemia: Secondary | ICD-10-CM | POA: Diagnosis not present

## 2024-02-08 DIAGNOSIS — I10 Essential (primary) hypertension: Secondary | ICD-10-CM | POA: Diagnosis not present

## 2024-02-14 DIAGNOSIS — D225 Melanocytic nevi of trunk: Secondary | ICD-10-CM | POA: Diagnosis not present

## 2024-02-14 DIAGNOSIS — L821 Other seborrheic keratosis: Secondary | ICD-10-CM | POA: Diagnosis not present

## 2024-02-14 DIAGNOSIS — L218 Other seborrheic dermatitis: Secondary | ICD-10-CM | POA: Diagnosis not present

## 2024-02-14 DIAGNOSIS — B353 Tinea pedis: Secondary | ICD-10-CM | POA: Diagnosis not present

## 2024-02-14 DIAGNOSIS — D1801 Hemangioma of skin and subcutaneous tissue: Secondary | ICD-10-CM | POA: Diagnosis not present

## 2024-02-15 ENCOUNTER — Other Ambulatory Visit (HOSPITAL_BASED_OUTPATIENT_CLINIC_OR_DEPARTMENT_OTHER): Payer: Self-pay

## 2024-02-15 MED ORDER — FLUZONE HIGH-DOSE 0.5 ML IM SUSY
0.5000 mL | PREFILLED_SYRINGE | Freq: Once | INTRAMUSCULAR | 0 refills | Status: AC
  Filled 2024-02-15: qty 0.5, 1d supply, fill #0

## 2024-03-01 ENCOUNTER — Other Ambulatory Visit (HOSPITAL_BASED_OUTPATIENT_CLINIC_OR_DEPARTMENT_OTHER): Payer: Self-pay

## 2024-03-01 MED ORDER — AREXVY 120 MCG/0.5ML IM SUSR
0.5000 mL | Freq: Once | INTRAMUSCULAR | 0 refills | Status: AC
  Filled 2024-03-01: qty 0.5, 1d supply, fill #0

## 2024-03-10 DIAGNOSIS — I10 Essential (primary) hypertension: Secondary | ICD-10-CM | POA: Diagnosis not present

## 2024-03-10 DIAGNOSIS — E782 Mixed hyperlipidemia: Secondary | ICD-10-CM | POA: Diagnosis not present

## 2024-03-24 ENCOUNTER — Telehealth: Payer: Self-pay | Admitting: Cardiovascular Disease

## 2024-03-24 MED ORDER — ROSUVASTATIN CALCIUM 20 MG PO TABS: 20.0000 mg | ORAL_TABLET | Freq: Every day | ORAL | 0 refills | Status: AC

## 2024-03-24 NOTE — Telephone Encounter (Signed)
 Prescription sent

## 2024-03-24 NOTE — Telephone Encounter (Signed)
*  STAT* If patient is at the pharmacy, call can be transferred to refill team.   1. Which medications need to be refilled? (please list name of each medication and dose if known)  rosuvastatin  (CRESTOR ) 20 MG tablet  2. Which pharmacy/location (including street and city if local pharmacy) is medication to be sent to? [facility] - [address], [place], [place] [address]  3. Do they need a 30 day or 90 day supply?  90 day supply

## 2024-04-09 DIAGNOSIS — E782 Mixed hyperlipidemia: Secondary | ICD-10-CM | POA: Diagnosis not present

## 2024-04-09 DIAGNOSIS — I10 Essential (primary) hypertension: Secondary | ICD-10-CM | POA: Diagnosis not present

## 2024-05-09 ENCOUNTER — Other Ambulatory Visit: Payer: Self-pay | Admitting: Pharmacist Clinician (PhC)/ Clinical Pharmacy Specialist

## 2024-05-09 MED ORDER — REPATHA SURECLICK 140 MG/ML ~~LOC~~ SOAJ: 140.0000 mg | SUBCUTANEOUS | 0 refills | Status: AC

## 2024-06-09 ENCOUNTER — Encounter: Payer: Self-pay | Admitting: Cardiovascular Disease

## 2024-06-09 ENCOUNTER — Ambulatory Visit: Attending: Cardiovascular Disease | Admitting: Cardiovascular Disease

## 2024-06-09 VITALS — BP 130/62 | HR 69 | Ht 71.0 in | Wt 185.0 lb

## 2024-06-09 DIAGNOSIS — E119 Type 2 diabetes mellitus without complications: Secondary | ICD-10-CM | POA: Diagnosis not present

## 2024-06-09 DIAGNOSIS — E7800 Pure hypercholesterolemia, unspecified: Secondary | ICD-10-CM | POA: Diagnosis not present

## 2024-06-09 DIAGNOSIS — I10 Essential (primary) hypertension: Secondary | ICD-10-CM | POA: Diagnosis not present

## 2024-06-09 DIAGNOSIS — I2510 Atherosclerotic heart disease of native coronary artery without angina pectoris: Secondary | ICD-10-CM

## 2024-06-09 NOTE — Patient Instructions (Signed)

## 2024-06-10 ENCOUNTER — Encounter: Payer: Self-pay | Admitting: Cardiovascular Disease

## 2024-06-10 NOTE — Progress Notes (Signed)
 " Cardiology Office Note   Date:  06/10/2024  ID:  Leory, Allinson August 15, 1945, MRN [id number] PCP: Dayna Motto, DO  [facility] Providers Cardiologist:  Jerel Balding, MD     History of Present Illness Tim James is a 79 y.o. male with elevated coronary calcium  score, type 2 diabetes mellitus managed with diet and exercise, hypercholesterolemia and hypertension, transitioning care from Dr. Gerlene Passe.  He is physically active and exercises regularly and follows a very healthy diet. He has normal renal function with a creatinine of 0.9.  He has lost 25 pounds in the last 3 months. The patient specifically denies any chest pain at rest or with exertion, dyspnea at rest or with exertion, orthopnea, paroxysmal nocturnal dyspnea, syncope, palpitations, focal neurological deficits, intermittent claudication, lower extremity edema, unexplained weight gain, cough, hemoptysis or wheezing.  On 01/21/2024 metabolic parameters were all within target range.  His hemoglobin A1c was 6.7%, total cholesterol 90, HDL 51, triglycerides 82 and LDL cholesterol 22.  When he checks his blood pressure at home it is consistently in the mid 120s/60s.  He does not smoke cigarettes.  Coronary calcium  score performed at age 47 was 69, 78th percentile for age and gender.  He is a retired editor, commissioning and has lived for extended periods of his life in [place] and particularly in [place], [place], [place] and [place].    Studies Reviewed EKG Interpretation Date/Time:  Friday June 09 2024 12:07:49 EST Ventricular Rate:  60 PR Interval:  136 QRS Duration:  100 QT Interval:  392 QTC Calculation: 392 R Axis:   33  Text Interpretation: Normal sinus rhythm Normal ECG When compared with ECG of 04-Jun-2023 09:32, Non-specific change in ST segment in Anterior leads Confirmed by Stina Gane ([id number]) on 06/09/2024 12:28:58 PM    01/21/2024 [facility] physicians Creatinine 0.91, glucose 126,  potassium 4.3 Hemoglobin A1c 6.7% Cholesterol 90, HDL 51, triglycerides 82, LDL 22  Risk Assessment/Calculations          Physical Exam VS:  BP 130/62   Pulse 69   Ht 5' 11 (1.803 m)   Wt 185 lb (83.9 kg)   SpO2 98%   BMI 25.80 kg/m        Wt Readings from Last 3 Encounters:  06/09/24 185 lb (83.9 kg)  06/04/23 200 lb 9.6 oz (91 kg)  02/17/23 203 lb 12.8 oz (92.4 kg)    GEN: Well nourished, well developed in no acute distress NECK: No JVD; No carotid bruits CARDIAC: RRR, no murmurs, rubs, gallops RESPIRATORY:  Clear to auscultation without rales, wheezing or rhonchi  ABDOMEN: Soft, non-tender, non-distended EXTREMITIES:  No edema; No deformity   ASSESSMENT AND PLAN CAD: He is completely asymptomatic, but a coronary calcium  score over 600 places him in a risk category equivalent to that of a patient with symptomatic coronary disease.  It is important to aggressively treat his risk factors.  He exercises regularly. HLP: His LDL cholesterol is remarkably low.  I think it is reasonable to reduce his rosuvastatin  to 10 mg daily since this may help with glycemic control.  Even on the lower dose of rosuvastatin  is very likely that his LDL cholesterol will still be well under 55.  Continue Repatha . DM2: He has made a diligent effort to lose weight and eat a very healthy diet.  It is very likely that his next hemoglobin A1c will be better.  Even now it is in acceptable range. HTN: Excellent control on monotherapy  with carvedilol .       Dispo: Reduce rosuvastatin  to 10 mg daily.  Follow-up in 1 year.  Signed, Jerel Balding, MD   "

## 2024-06-12 ENCOUNTER — Ambulatory Visit: Admitting: Cardiovascular Disease
